# Patient Record
Sex: Female | Born: 1955 | Race: Black or African American | Hispanic: No | Marital: Single | Smoking: Never smoker
Health system: Southern US, Community
[De-identification: ages and names within clinical notes are randomized; demographics above are authoritative.]

## PROBLEM LIST (undated history)

## (undated) DIAGNOSIS — J45909 Unspecified asthma, uncomplicated: Secondary | ICD-10-CM

## (undated) DIAGNOSIS — F419 Anxiety disorder, unspecified: Secondary | ICD-10-CM

## (undated) DIAGNOSIS — G35 Multiple sclerosis: Secondary | ICD-10-CM

## (undated) DIAGNOSIS — I10 Essential (primary) hypertension: Secondary | ICD-10-CM

## (undated) HISTORY — DX: Anxiety disorder, unspecified: F41.9

## (undated) HISTORY — DX: Unspecified asthma, uncomplicated: J45.909

## (undated) HISTORY — PX: EYE SURGERY: SHX253

## (undated) HISTORY — DX: Essential (primary) hypertension: I10

## (undated) HISTORY — DX: Multiple sclerosis: G35

---

## 2020-09-06 DIAGNOSIS — Z131 Encounter for screening for diabetes mellitus: Secondary | ICD-10-CM | POA: Diagnosis not present

## 2020-09-06 DIAGNOSIS — Z13228 Encounter for screening for other metabolic disorders: Secondary | ICD-10-CM | POA: Diagnosis not present

## 2020-09-06 DIAGNOSIS — Z79899 Other long term (current) drug therapy: Secondary | ICD-10-CM | POA: Diagnosis not present

## 2020-09-22 ENCOUNTER — Other Ambulatory Visit: Payer: Self-pay | Admitting: Internal Medicine

## 2020-09-22 DIAGNOSIS — Z780 Asymptomatic menopausal state: Secondary | ICD-10-CM

## 2020-10-10 ENCOUNTER — Encounter: Payer: Self-pay | Admitting: Neurology

## 2020-10-17 ENCOUNTER — Other Ambulatory Visit: Payer: Self-pay | Admitting: Internal Medicine

## 2020-10-17 DIAGNOSIS — Z780 Asymptomatic menopausal state: Secondary | ICD-10-CM

## 2020-10-18 ENCOUNTER — Ambulatory Visit: Payer: Medicare PPO | Admitting: Obstetrics and Gynecology

## 2020-10-19 ENCOUNTER — Other Ambulatory Visit: Payer: Medicare PPO

## 2020-10-20 ENCOUNTER — Other Ambulatory Visit: Payer: Self-pay

## 2020-10-20 ENCOUNTER — Ambulatory Visit
Admission: RE | Admit: 2020-10-20 | Discharge: 2020-10-20 | Disposition: A | Discharge status: Home / Self Care | Payer: Medicare PPO | Source: Ambulatory Visit | Attending: Internal Medicine | Admitting: Internal Medicine

## 2020-10-20 DIAGNOSIS — Z780 Asymptomatic menopausal state: Secondary | ICD-10-CM

## 2020-10-27 LAB — EXTERNAL GENERIC LAB PROCEDURE: COLOGUARD: NEGATIVE

## 2020-10-27 LAB — COLOGUARD: COLOGUARD: NEGATIVE

## 2020-12-01 ENCOUNTER — Ambulatory Visit (INDEPENDENT_AMBULATORY_CARE_PROVIDER_SITE_OTHER): Payer: Medicare PPO | Admitting: Obstetrics and Gynecology

## 2020-12-01 ENCOUNTER — Other Ambulatory Visit: Payer: Self-pay

## 2020-12-01 ENCOUNTER — Other Ambulatory Visit (HOSPITAL_COMMUNITY)
Admission: RE | Admit: 2020-12-01 | Discharge: 2020-12-01 | Disposition: A | Discharge status: Home / Self Care | Payer: Medicare PPO | Source: Ambulatory Visit | Attending: Obstetrics and Gynecology | Admitting: Obstetrics and Gynecology

## 2020-12-01 ENCOUNTER — Encounter: Payer: Self-pay | Admitting: Obstetrics and Gynecology

## 2020-12-01 VITALS — BP 130/82 | HR 67 | Ht 62.0 in | Wt 140.0 lb

## 2020-12-01 DIAGNOSIS — Z01419 Encounter for gynecological examination (general) (routine) without abnormal findings: Secondary | ICD-10-CM

## 2020-12-01 DIAGNOSIS — Z124 Encounter for screening for malignant neoplasm of cervix: Secondary | ICD-10-CM

## 2020-12-01 DIAGNOSIS — Z1151 Encounter for screening for human papillomavirus (HPV): Secondary | ICD-10-CM | POA: Insufficient documentation

## 2020-12-01 NOTE — Progress Notes (Signed)
NGYN patient presents for Annual Exam .  Mammogram: 2 years ago per pt  Last pap: >3 yrs no Hs of Abnormal Paps Family Hx of Breast Cancer: None Bone Density: 10/20/20  CC: None

## 2020-12-01 NOTE — Progress Notes (Signed)
Subjective:     Joyce Hayes is a 65 y.o. female and is here for a comprehensive physical exam. The patient reports no problems. She denies any episodes of postmenopausal vaginal bleeding. She denies any urinary incontinence. She is sexually active and reports occasional vaginal dryness managed with OTC vaginal lubricant. Patient denies any pelvic pain or abnormal discharge  Past Medical History:  Diagnosis Date   Anxiety    Asthma    Hypertension    History reviewed. No pertinent surgical history. Family History  Problem Relation Age of Onset   Hypertension Mother    Alzheimer's disease Mother    Hypertension Father    Osteoporosis Father    Hepatitis C Brother      Social History   Socioeconomic History   Marital status: Unknown    Spouse name: Not on file   Number of children: Not on file   Years of education: Not on file   Highest education level: Not on file  Occupational History   Not on file  Tobacco Use   Smoking status: Never   Smokeless tobacco: Never  Vaping Use   Vaping Use: Never used  Substance and Sexual Activity   Alcohol use: Yes    Alcohol/week: 1.0 standard drink    Types: 1 Glasses of wine per week    Comment: socially   Drug use: Not Currently   Sexual activity: Yes    Partners: Male    Birth control/protection: None  Other Topics Concern   Not on file  Social History Narrative   Not on file   Social Determinants of Health   Financial Resource Strain: Not on file  Food Insecurity: Not on file  Transportation Needs: Not on file  Physical Activity: Not on file  Stress: Not on file  Social Connections: Not on file  Intimate Partner Violence: Not on file   Health Maintenance  Topic Date Due   COVID-19 Vaccine (1) Never done   HIV Screening  Never done   Hepatitis C Screening  Never done   PAP SMEAR-Modifier  Never done   COLONOSCOPY (Pts 45-26yrs Insurance coverage will need to be confirmed)  Never done   MAMMOGRAM  Never done    Zoster Vaccines- Shingrix (1 of 2) Never done   INFLUENZA VACCINE  01/15/2021   TETANUS/TDAP  03/10/2022   Pneumococcal Vaccine 110-63 Years old  Aged Out   HPV VACCINES  Aged Out       Review of Systems Pertinent items noted in HPI and remainder of comprehensive ROS otherwise negative.   Objective:  Blood pressure 130/82, pulse 67, height 5\' 2"  (1.575 m), weight 140 lb (63.5 kg).     GENERAL: Well-developed, well-nourished female in no acute distress.  HEENT: Normocephalic, atraumatic. Sclerae anicteric.  NECK: Supple. Normal thyroid.  LUNGS: Clear to auscultation bilaterally.  HEART: Regular rate and rhythm. BREASTS: Symmetric in size. No palpable masses or lymphadenopathy, skin changes, or nipple drainage. ABDOMEN: Soft, nontender, nondistended. No organomegaly. PELVIC: Normal external female genitalia. Vagina is pink and rugated.  Normal discharge. Normal appearing cervix. Uterus is normal in size.  No adnexal mass or tenderness. EXTREMITIES: No cyanosis, clubbing, or edema, 2+ distal pulses.    Assessment:    Healthy female exam.      Plan:    Pap smear collected Screening mammogram ordered Patient recently had a dexa scan Patient had colon cancer screening last month Patient recently seen by PCP and had blood work done Patient will be  contacted with abnormal results See After Visit Summary for Counseling Recommendations

## 2020-12-05 LAB — CYTOLOGY - PAP
Adequacy: ABSENT
Comment: NEGATIVE
High risk HPV: NEGATIVE

## 2020-12-06 ENCOUNTER — Ambulatory Visit (HOSPITAL_BASED_OUTPATIENT_CLINIC_OR_DEPARTMENT_OTHER)
Admission: RE | Admit: 2020-12-06 | Discharge: 2020-12-06 | Disposition: A | Discharge status: Home / Self Care | Payer: Medicare PPO | Source: Ambulatory Visit | Attending: Obstetrics and Gynecology | Admitting: Obstetrics and Gynecology

## 2020-12-06 ENCOUNTER — Encounter (HOSPITAL_BASED_OUTPATIENT_CLINIC_OR_DEPARTMENT_OTHER): Payer: Self-pay | Admitting: Radiology

## 2020-12-06 ENCOUNTER — Other Ambulatory Visit: Payer: Self-pay

## 2020-12-06 DIAGNOSIS — Z1231 Encounter for screening mammogram for malignant neoplasm of breast: Secondary | ICD-10-CM | POA: Diagnosis not present

## 2020-12-06 DIAGNOSIS — Z01419 Encounter for gynecological examination (general) (routine) without abnormal findings: Secondary | ICD-10-CM | POA: Diagnosis not present

## 2020-12-06 IMAGING — MG MM DIGITAL SCREENING BILAT W/ TOMO AND CAD
8 series · 8 of 24 positions shown · non-contrast
Comparison: Previous exam(s).

CLINICAL DATA: Screening.

EXAM:
DIGITAL SCREENING BILATERAL MAMMOGRAM WITH TOMOSYNTHESIS AND CAD
TECHNIQUE: Bilateral screening digital craniocaudal and mediolateral oblique
mammograms were obtained. Bilateral screening digital breast
tomosynthesis was performed. The images were evaluated with
computer-aided detection.

[R CC synth-2D]
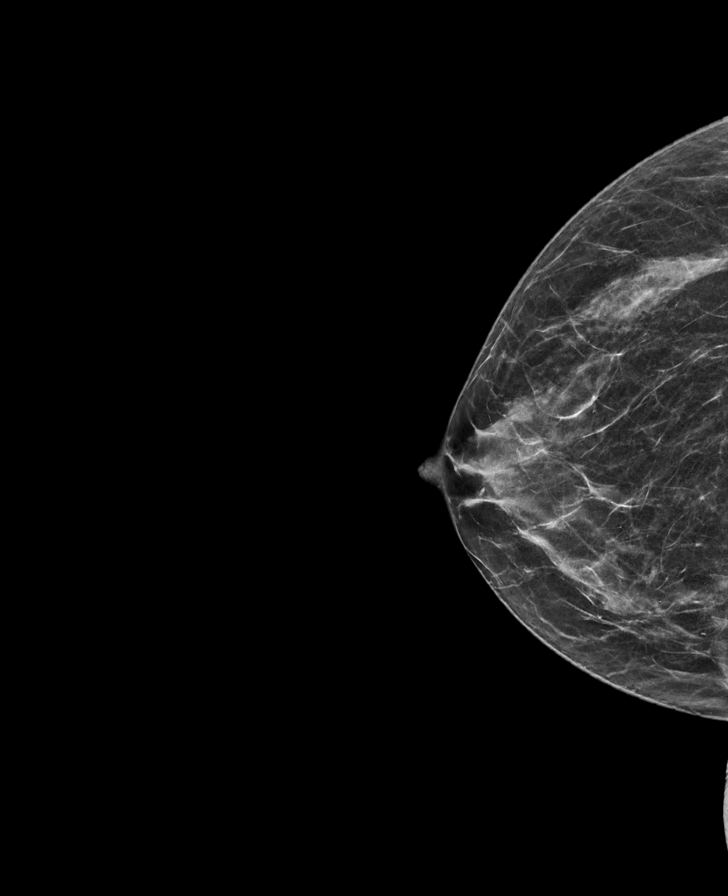

[R MLO synth-2D]
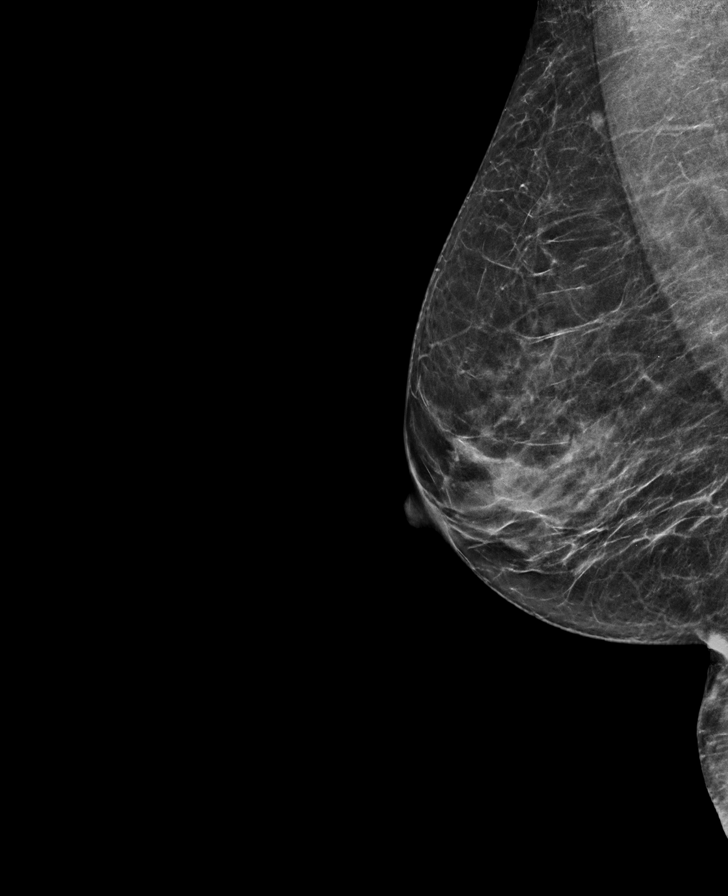

[L CC synth-2D]
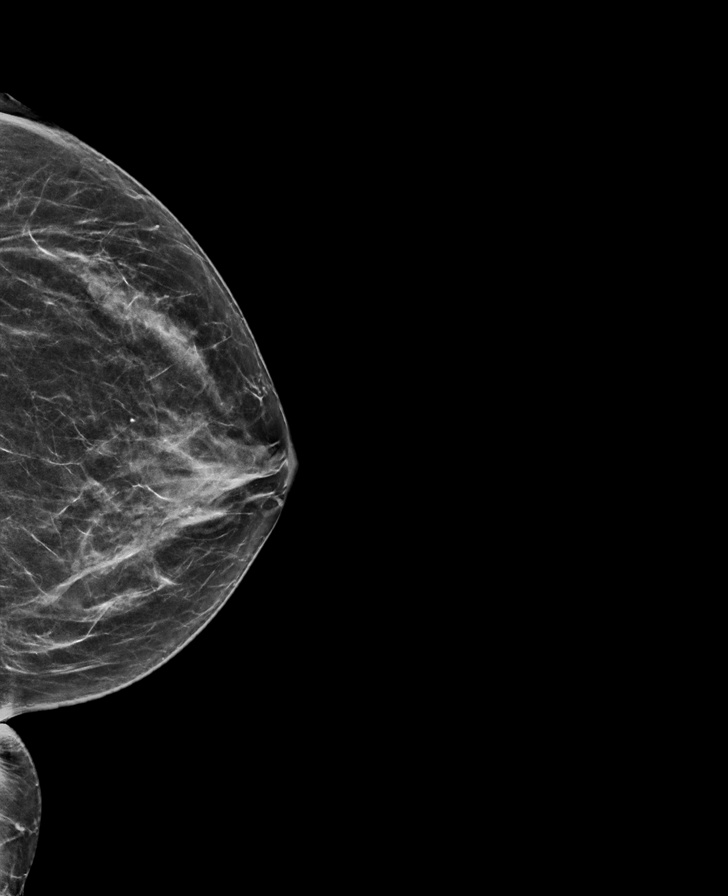

[L MLO synth-2D]
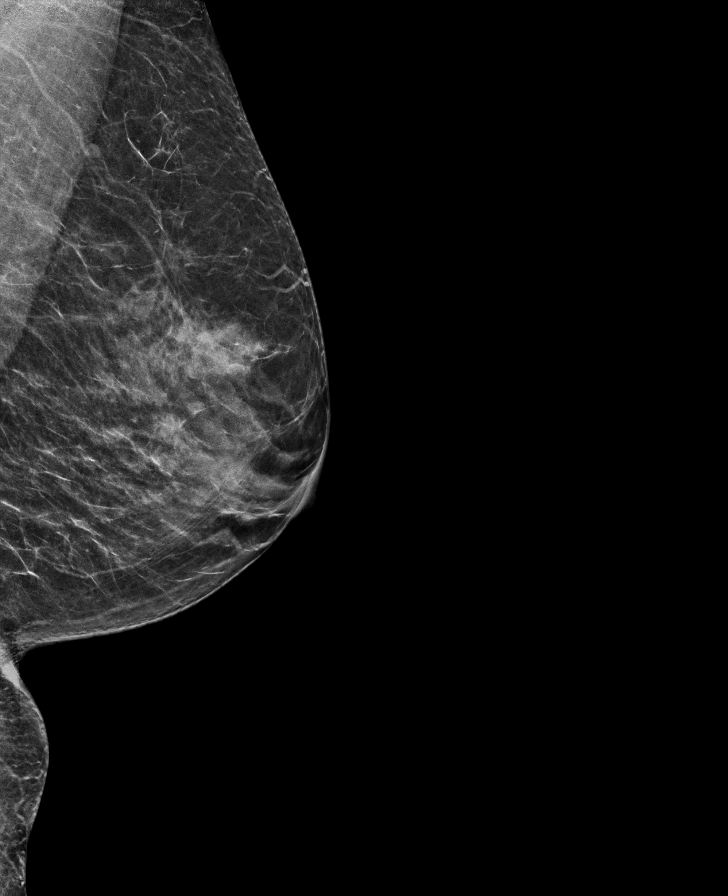

[L CC tomo · tomo slice 32/63.0]
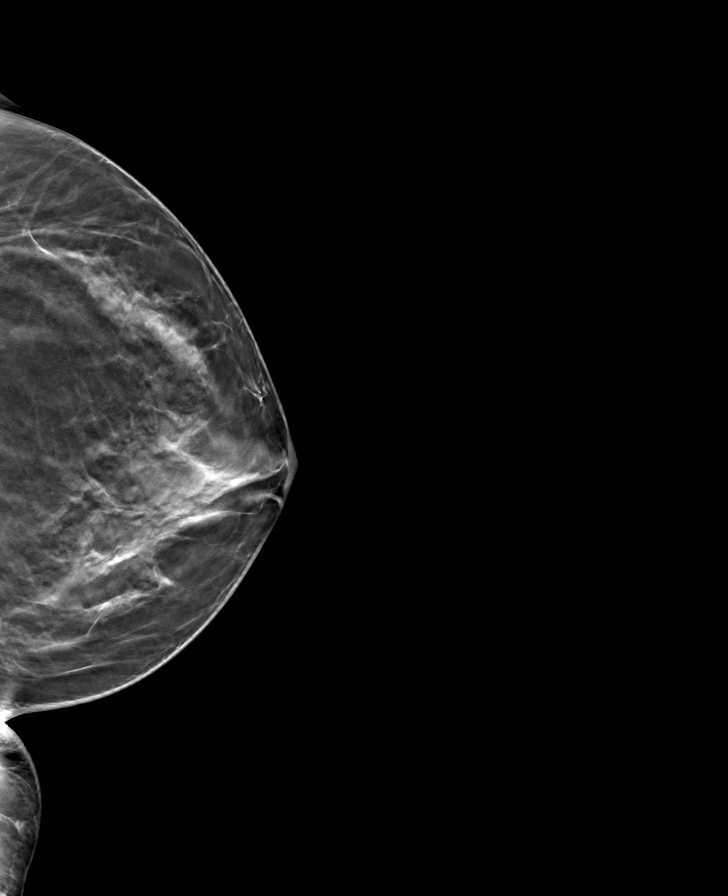

[R MLO tomo · tomo slice 31/61.0]
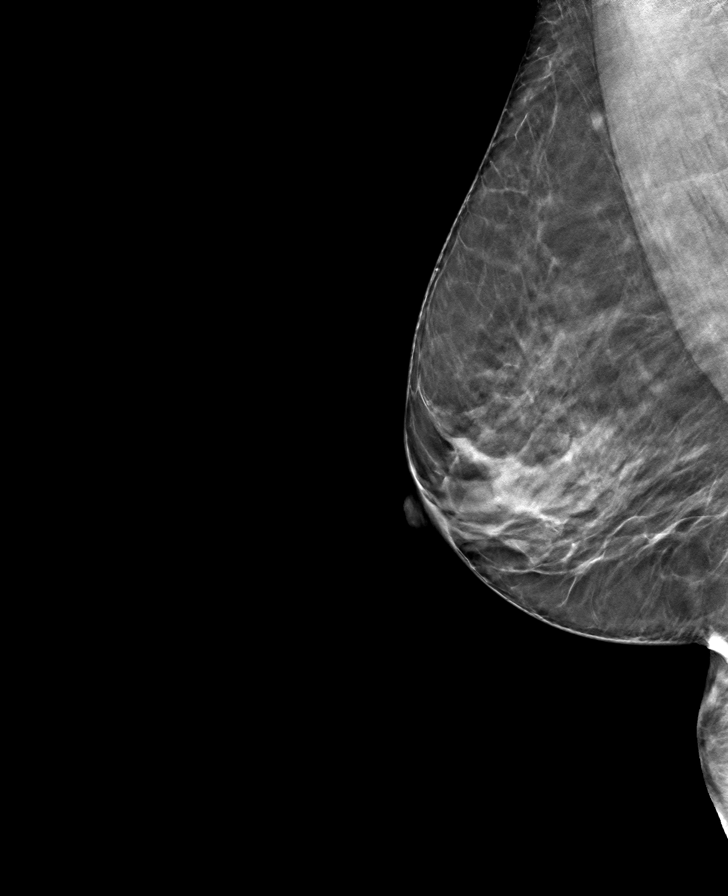

[R CC tomo · tomo slice 29/56.0]
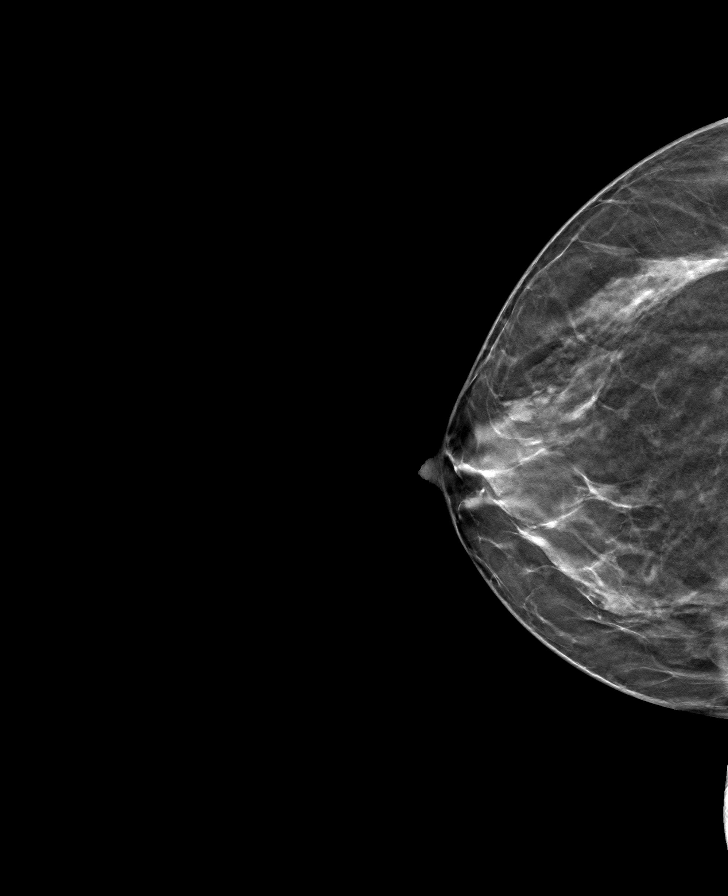

[L MLO tomo · tomo slice 30/59.0]
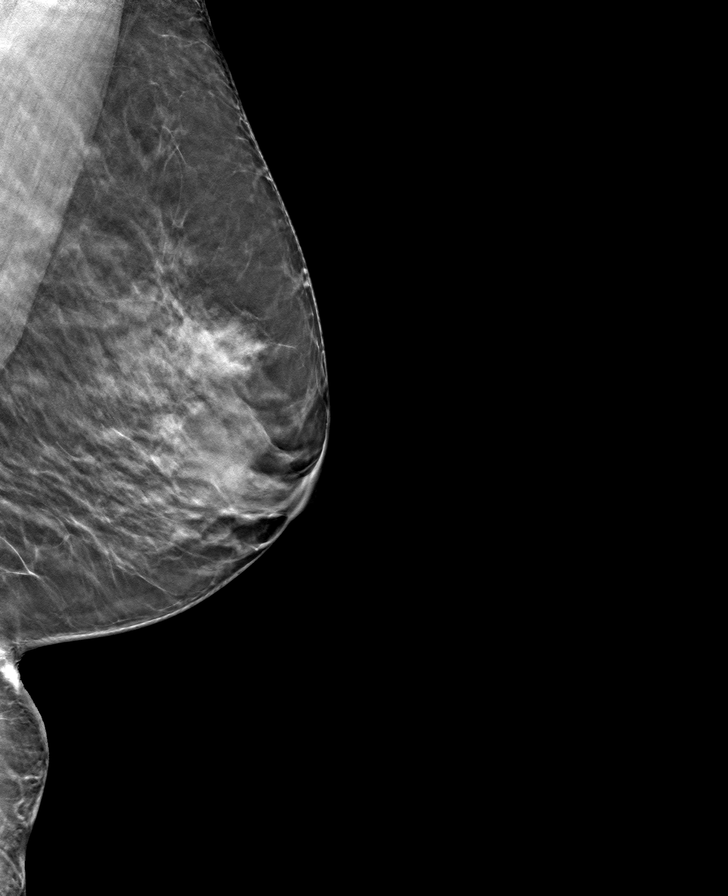

[8 of 24 positions shown; findings below may reference images not displayed]

ACR Breast Density Category b: There are scattered areas of
fibroglandular density.
FINDINGS: There are no findings suspicious for malignancy.
IMPRESSION: No mammographic evidence of malignancy. A result letter of this
screening mammogram will be mailed directly to the patient.

RECOMMENDATION:
Screening mammogram in one year. (Code:[BY])

BI-RADS CATEGORY  1: Negative.

## 2020-12-14 NOTE — Progress Notes (Signed)
NEUROLOGY CONSULTATION NOTE  Joyce Hayes MRN: [id number] DOB: 08-Apr-1956  Referring provider: Effie Shy, MD Primary care provider: Effie Shy, MD  Reason for consult:  multiple schlerosis  Assessment/Plan:   Multiple sclerosis Headache Depression/anxiety  Set up for next Ocrevus infusion in 5 months. Refilled duloxetine, amitriptyline and olanzapine.  Will check routine ECG to assess QTc MRI of brain and cervical spine with and without contrast Check quantitative immunoglobulin panel and vit D level today and again in 6 months (prior to follow up) D3 5000 IU daily Follow up 6 months.   Subjective:  Joyce Hayes is a 65 year old -harightnded female with MS, HTN, asthma and anxiety who presents to establish care for multiple sclerosis.  History supplemented by prior neurologist's and referring provider's notes.  Current DMT:  Ocrevus (started 09/17/2017, last dose 11/14/2020) Current medications:  Cymbalta 60mg  QD (pain and depression), amitriptyline 10mg  QHS (sleep, headache and depression), olanzapine 5mg  QHS (sleep, paranoid ideation), diazepam 5mg  BID PRN, D3 5000 IU QD, B12 QD, CoQ10  Diagnosed with MS in 2000 presenting with right foot numbness.  Diagnosed via MRI.  Did not have an LP.  Treated with steroids.  Vision:  Residual blurred vision in right eye from optic neuritis (approx 2004). Motor:  No issues but sometimes grasping with her hands feels a little weak with overuse. Sensory:  No Pain:  Chronic neck pain.  Chronic low back pain, right leg radiculopathy Headaches:  a pounding pain across the forehead.  No nausea, vomiting, photophobia, phonophobia or visual disturbance.  Typically last 30 minutes and occur about once or twice a month.  Treats with Advil.  Often associated with elevated blood pressure. Gait:  Sometimes feels a little off balance. Bowel/Bladder:  Some urinary incontinence.  History of constipation currently  well-managed. Fatigue:  Sometimes feels tired but nothing significant. Cognition:  Mild cognitive disorder. Mood:  Anxiety, depression - comes and goes.  Separated from her husband.  History of paranoid delusions (previously believed that her husband was planning to kill her and that FBI was working with her).  Past DMT:  Avonex ([phone], stopped due to flu-like symptoms), Rebif (2001-early 2019 (injection site reaction, depression, lost efficacy) Other past medications:  Wellbutrin  Covid-19 Vaccine:  Moderna - second dose 10/14/2019  Imaging: 11/03/2007 MRI BRAIN:  Reportedly stable compared to prior study from 2007. 06/15/2009 MRI BRAIN:  There are several nonenhancing periventricular white matter and juxtacortical white matter hyperintensities typical of MS.  There is mild atrophy and ventricular enlargement.  Reportedly showed possible minimal progression compared with prior study from 06/20/2005. 12/11/2009 MRI C-SPINE:  Nonenhancing MS lesions, C2 and C4-5.  Minor disc disease. 12/11/2009 MRI T-SPINE:  Nonenhancing MS lesion, T2-3. 08/22/2014 MRI C-SPINE:  Nonenhancing plaque in the dorsal column at C4 and C4-5, unchanged from prior study of 04/21/2012.  The previously described C2 lesion back in 2011 is not seen.  There is a sbutle lesion noted at T2-3. 08/22/2014 MRI T-SPINE:  Limited quality; T2-3 lesion is better seen on C-spine imaging in 2013 and 2016 and appears unchanged. 04/21/2017 MRI BRAIN:  Unchanged from prior study of 08/22/2014 04/21/2017 MRI C-SPINE:  Unchanged from prior study of 08/22/2014 03/30/2019 MRI BRAIN:  Unchanged from 04/21/2017. 03/30/2019 MRI C-SPINE:  Unchanged from 04/21/2017, with a stable right dorsal hyperintesity at C4-5 typical of MS.  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  Severe weaknesses in visual spatial and verbal function noted.  Moderate  impairment in executive  function, attention, problem solving, working memory, and global cognitive score.  Pure memory was only slightly diminished according to [employer].  No family history of MS.  Mother had Alzheimer's disease.   PAST MEDICAL HISTORY: Past Medical History:  Diagnosis Date   Anxiety    Asthma    Hypertension    Multiple sclerosis (HCC)     PAST SURGICAL HISTORY: No past surgical history on file.  MEDICATIONS: Current Outpatient Medications on File Prior to Visit  Medication Sig Dispense Refill   amitriptyline (ELAVIL) 10 MG tablet 1 tablet at bedtime.     bisoprolol-hydrochlorothiazide (ZIAC) 5-6.25 MG tablet 1 tablet     cyanocobalamin 2000 MCG tablet      estradiol (ESTRACE) 0.1 MG/GM vaginal cream Place 1 Applicatorful vaginally at bedtime.     Ocrelizumab (OCREVUS IV) Inject into the vein.     No current facility-administered medications on file prior to visit.    ALLERGIES: Allergies  Allergen Reactions   Chocolate    Grass Pollen(K-O-R-T-Swt Vern) Other (See Comments)   Soybean-Containing Drug Products Other (See Comments)    FAMILY HISTORY: Family History  Problem Relation Age of Onset   Hypertension Mother    Alzheimer's disease Mother    Hypertension Father    Osteoporosis Father    Hepatitis C Brother     Objective:  Blood pressure 133/85, pulse 62, height 5\' 2"  (1.575 m), weight 142 lb 12.8 oz (64.8 kg), SpO2 97 %. General: No acute distress.  Patient appears well-groomed.   Head:  Normocephalic/atraumatic Eyes:  fundi examined but not visualized Neck: supple, no paraspinal tenderness, full range of motion Back: No paraspinal tenderness Heart: regular rate and rhythm Lungs: Clear to auscultation bilaterally. Vascular: No carotid bruits. Neurological Exam: Mental status: alert and oriented to person, place, and time, recent and remote memory intact, fund of knowledge intact, attention and concentration intact, speech fluent and not dysarthric, language  intact. Cranial nerves: CN I: not tested CN II: pupils equal, round and reactive to light, visual fields intact CN III, IV, VI:  full range of motion, no nystagmus, no ptosis CN V: facial sensation intact. CN VII: upper and lower face symmetric CN VIII: hearing intact CN IX, X: gag intact, uvula midline CN XI: sternocleidomastoid and trapezius muscles intact CN XII: tongue midline Bulk & Tone: normal, no fasciculations. Motor:  muscle strength 5/5 throughout Sensation:  Pinprick, temperature and vibratory sensation intact. Deep Tendon Reflexes:  2+ throughout,  toes downgoing.   Finger to nose testing:  Without dysmetria.   Heel to shin:  Without dysmetria.   Gait:  Normal station and stride.  Timed 25 foot walk 6 seconds.  Some mild unsteadiness with tandem walk.  Romberg negative.    Thank you for allowing me to take part in the care of this patient.  , DO  CC: Shon Millet, MD

## 2020-12-15 ENCOUNTER — Ambulatory Visit: Payer: Medicare PPO | Admitting: Neurology

## 2020-12-15 ENCOUNTER — Encounter: Payer: Self-pay | Admitting: Neurology

## 2020-12-15 ENCOUNTER — Other Ambulatory Visit (INDEPENDENT_AMBULATORY_CARE_PROVIDER_SITE_OTHER): Payer: Medicare PPO

## 2020-12-15 ENCOUNTER — Other Ambulatory Visit: Payer: Self-pay

## 2020-12-15 VITALS — BP 133/85 | HR 62 | Ht 62.0 in | Wt 142.8 lb

## 2020-12-15 DIAGNOSIS — F419 Anxiety disorder, unspecified: Secondary | ICD-10-CM

## 2020-12-15 DIAGNOSIS — R519 Headache, unspecified: Secondary | ICD-10-CM

## 2020-12-15 DIAGNOSIS — G35 Multiple sclerosis: Secondary | ICD-10-CM | POA: Diagnosis not present

## 2020-12-15 DIAGNOSIS — Z79899 Other long term (current) drug therapy: Secondary | ICD-10-CM

## 2020-12-15 DIAGNOSIS — F32A Depression, unspecified: Secondary | ICD-10-CM

## 2020-12-15 LAB — VITAMIN D 25 HYDROXY (VIT D DEFICIENCY, FRACTURES): VITD: 71.81 ng/mL (ref 30.00–100.00)

## 2020-12-15 MED ORDER — AMITRIPTYLINE HCL 10 MG PO TABS: 10.0000 mg | ORAL_TABLET | Freq: Every day | ORAL | 5 refills | Status: DC

## 2020-12-15 MED ORDER — DULOXETINE HCL 60 MG PO CPEP: 60.0000 mg | ORAL_CAPSULE | Freq: Every day | ORAL | 5 refills | Status: DC

## 2020-12-15 MED ORDER — OLANZAPINE 5 MG PO TABS: 5.0000 mg | ORAL_TABLET | Freq: Every day | ORAL | 5 refills | Status: DC

## 2020-12-15 NOTE — Progress Notes (Signed)
Pt advised of her vtiamin d level.

## 2020-12-15 NOTE — Patient Instructions (Addendum)
Refilled amitriptyline, duloxetine and olanzapine.  Will check an EKG  MRI of brain and cervical spine with and without contrast. We have sent a referral to [facility] for your MRI and they will call you directly to schedule your appointment. They are located at [address] [facility]. If you need to contact them directly please call [phone].  Check quantitative immunoglobulin panel and vit D level today and again in 6 months.  If nobody contacted you to get labs by 2 weeks prior to next appointment, contact us. Your provider has requested that you have labwork completed today. Please go to [facility] ([phone]) on the second floor of this building before leaving the office today. You do not need to check in. If you are not called within 15 minutes please check with the front desk.   Plan for next Ocrevus infusion in 5 months. Follow up 6 months.

## 2020-12-16 LAB — IGG, IGA, IGM
IgG (Immunoglobin G), Serum: 1601 mg/dL — ABNORMAL HIGH (ref 600–1540)
IgM, Serum: 34 mg/dL — ABNORMAL LOW (ref 50–300)
Immunoglobulin A: 249 mg/dL (ref 70–320)

## 2020-12-19 ENCOUNTER — Telehealth: Payer: Self-pay | Admitting: Pharmacy Technician

## 2020-12-19 NOTE — Telephone Encounter (Signed)
Auth Submission: PENDING Payer: [employer]/MEDICARE Medication & CPT/J Code(s) submitted: Ocrevus [employer]) [phone] Route of submission (phone, fax, portal): COVER MY MED Auth type: Buy/Bill Units/visits requested: 2 Reference number: [id number]   Will update once we receive a response.

## 2020-12-20 ENCOUNTER — Telehealth: Payer: Self-pay | Admitting: Neurology

## 2020-12-20 NOTE — Telephone Encounter (Signed)
Auth Submission: APPROVED Payer: [employer]/MEDICARE Medication & CPT/J Code(s) submitted: Joyce Hayes Joyce Hayes) [phone] Route of submission (phone, fax, portal): COVER MY MED Auth type: Buy/Bill Units/visits requested: 2 Reference number: [id number]  REP: [place]  Patient had previous PA on file/approved -  PA# [id number]  APPROVAL DATE: 10/13/20 - 06/16/21.  COVER MY MEDS: APPROVED:   Submitted new PA on 12/19/20 (due to change of site) KEY: [id number]  PA CASE#: [id number]  Confirmed with REP ([place]) change of site was updated on previous approval.

## 2020-12-20 NOTE — Telephone Encounter (Signed)
MRI- GI they should call you. Phone number given to pt.

## 2020-12-20 NOTE — Telephone Encounter (Signed)
Pt called in and wanted to update the pharmacy. The pharmacy should be n [address] [phone]. She also wants a call back from sheena regarding infusion. She has questions about it.

## 2020-12-21 ENCOUNTER — Other Ambulatory Visit: Payer: Self-pay | Admitting: Pharmacy Technician

## 2020-12-21 NOTE — Telephone Encounter (Signed)
Pt States [employer] wanted to know if the infusion center in a provider office or is it a center.    Advised pt to have [facility] call [place] [facility] directly.

## 2020-12-22 ENCOUNTER — Ambulatory Visit: Payer: Medicare PPO | Admitting: Student

## 2020-12-22 ENCOUNTER — Other Ambulatory Visit: Payer: Self-pay

## 2020-12-22 VITALS — BP 129/74 | HR 64

## 2020-12-22 DIAGNOSIS — I10 Essential (primary) hypertension: Secondary | ICD-10-CM

## 2020-12-22 DIAGNOSIS — G35 Multiple sclerosis: Secondary | ICD-10-CM

## 2020-12-22 DIAGNOSIS — Z5181 Encounter for therapeutic drug level monitoring: Secondary | ICD-10-CM

## 2020-12-22 NOTE — Progress Notes (Signed)
Patient presents to our office today for routine EKG prior to infusion therapy for multiple sclerosis with Ocrevus by Dr. Everlena Cooper. QTc  is normal on EKG today.   EKG 12/22/2020: Sinus rhythm at a rate of 64 bpm.  Normal axis.  Normal QTc.  No evidence of ischemia or underlying injury pattern.    ICD-10-CM   1. Multiple sclerosis (HCC)  G35 EKG 12-Lead    2. Essential hypertension  I10     3. Encounter for therapeutic drug monitoring  Z51.81       Joyce Halsted, PA-C 12/22/2020, 12:30 PM Office: [phone]  CC: Dr. Everlena Cooper

## 2020-12-25 ENCOUNTER — Telehealth: Payer: Self-pay | Admitting: Neurology

## 2020-12-25 ENCOUNTER — Other Ambulatory Visit: Payer: Self-pay | Admitting: Neurology

## 2020-12-25 MED ORDER — DIAZEPAM 5 MG PO TABS: ORAL_TABLET | ORAL | 0 refills | Status: DC

## 2020-12-25 NOTE — Telephone Encounter (Signed)
Pt is sch for an MRI on 01/06/21 and she is claustrophobic and would like to have something called in to [facility] on [address] for her to take.

## 2020-12-26 NOTE — Telephone Encounter (Signed)
PER Dr.Jaffe Done. Patient will need a driver to and from the MRI.   LMOVM.

## 2020-12-27 ENCOUNTER — Telehealth: Payer: Self-pay

## 2020-12-27 MED ORDER — OLANZAPINE 5 MG PO TABS: 5.0000 mg | ORAL_TABLET | Freq: Every day | ORAL | 5 refills | Status: DC

## 2020-12-27 MED ORDER — AMITRIPTYLINE HCL 10 MG PO TABS: 10.0000 mg | ORAL_TABLET | Freq: Every day | ORAL | 5 refills | Status: DC

## 2020-12-27 MED ORDER — DULOXETINE HCL 60 MG PO CPEP: 60.0000 mg | ORAL_CAPSULE | Freq: Every day | ORAL | 5 refills | Status: DC

## 2020-12-27 NOTE — Telephone Encounter (Signed)
Fax received from [facility] in pharmacy)  Please send scripts amtriptlyine, Duxoxtetine,olanzapine pt changing to mail in pharmacy.   Scripts sent to [facility] on day of visit cancelled.

## 2021-01-06 ENCOUNTER — Other Ambulatory Visit: Payer: Self-pay

## 2021-01-06 ENCOUNTER — Ambulatory Visit
Admission: RE | Admit: 2021-01-06 | Discharge: 2021-01-06 | Disposition: A | Discharge status: Home / Self Care | Payer: Medicare PPO | Source: Ambulatory Visit | Attending: Neurology | Admitting: Neurology

## 2021-01-06 DIAGNOSIS — G35 Multiple sclerosis: Secondary | ICD-10-CM

## 2021-01-06 IMAGING — MR MR CERVICAL SPINE WO/W CM
5 of 8 series · 26 of 48 positions shown · IV contrast (13ml multihance)
Comparison: Brain MRI the same day reported separately.

CLINICAL DATA: 64-year-old female diagnosed with multiple sclerosis
in the [AGE].
TECHNIQUE: Multiplanar and multiecho pulse sequences of the cervical spine, to
include the craniocervical junction and cervicothoracic junction,
were obtained without and with intravenous contrast.

CONTRAST:  13mL MULTIHANCE GADOBENATE DIMEGLUMINE 529 MG/ML IV SOLN
in conjunction with contrast enhanced imaging of the brain reported
separately.

[Series 5: T1 · sagittal · 3.0mm · 0.66mm/px · 4 of 15 slices shown (1 of 3)]
[im 1/15]
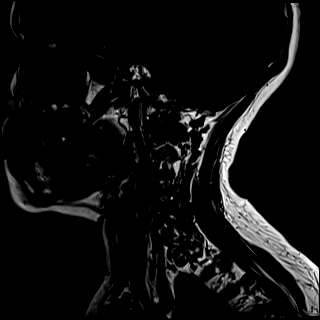
[im 5/15]
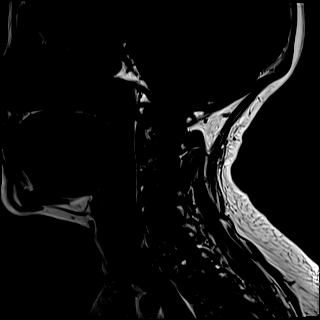
[im 10/15]
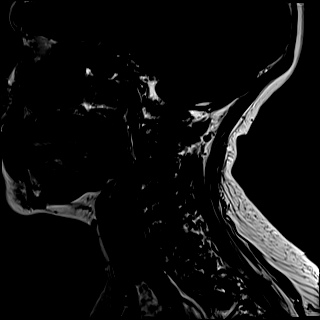
[im 15/15]
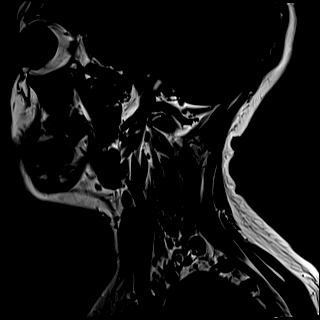

[Series 7: T2 · axial · 3.0mm · 0.50mm/px · z∈[-157,-65]mm · 8 of 30 slices shown (1 of 2)]
[im 1/30]
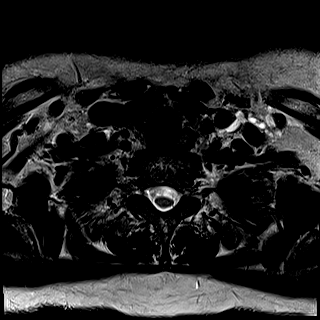
[im 5/30]
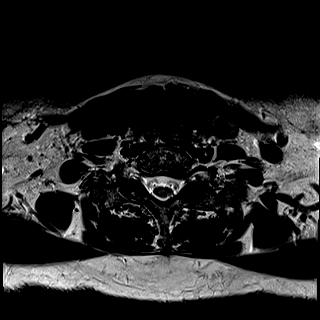
[im 9/30]
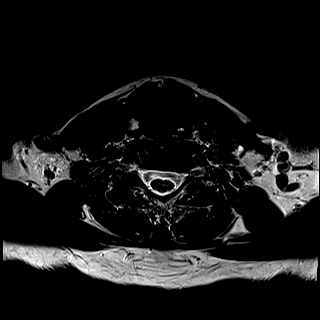
[im 13/30]
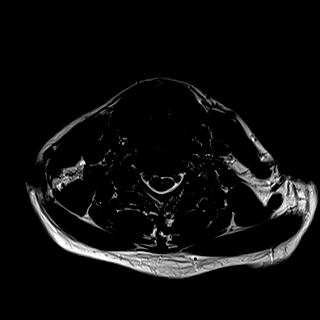
[im 17/30]
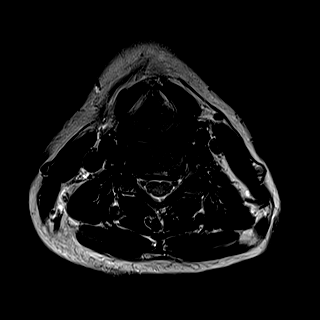
[im 21/30]
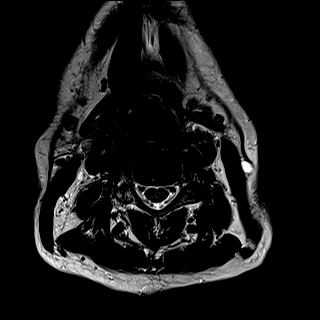
[im 25/30]
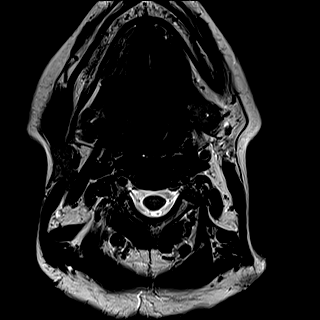
[im 30/30]
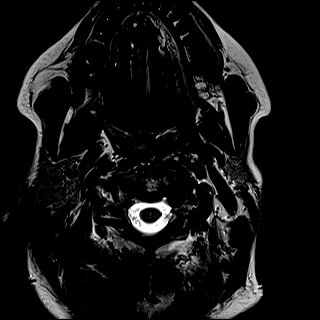

[Series 9: T1 · axial · non-contrast · 3.0mm · 0.31mm/px · z∈[-157,-65]mm · 8 of 30 slices shown (2 of 3)]
[im 1/30]
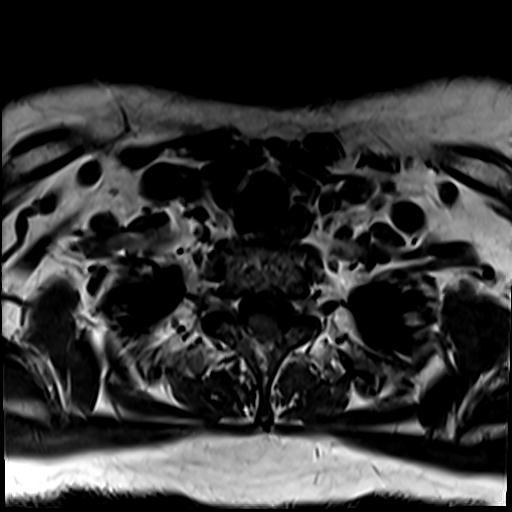
[im 5/30]
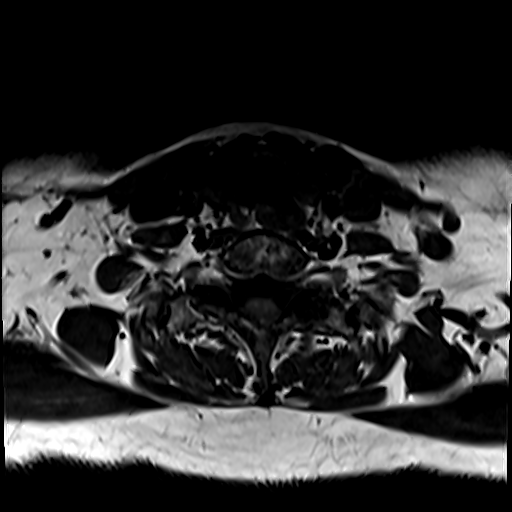
[im 9/30]
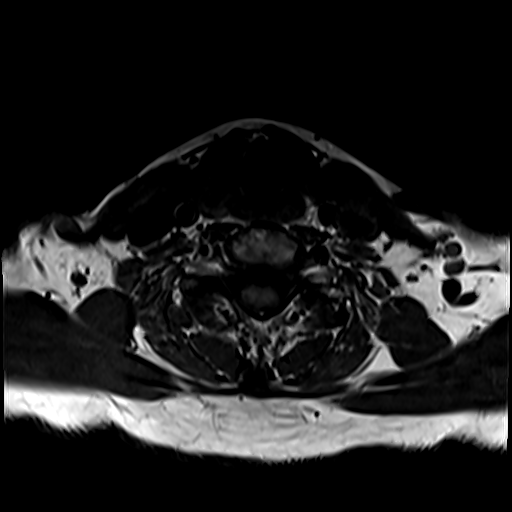
[im 13/30]
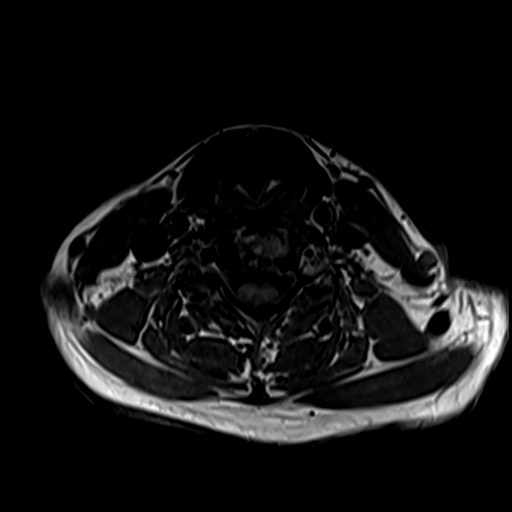
[im 17/30]
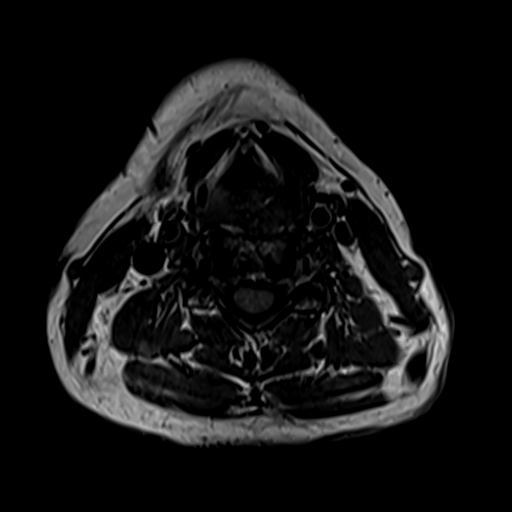
[im 21/30]
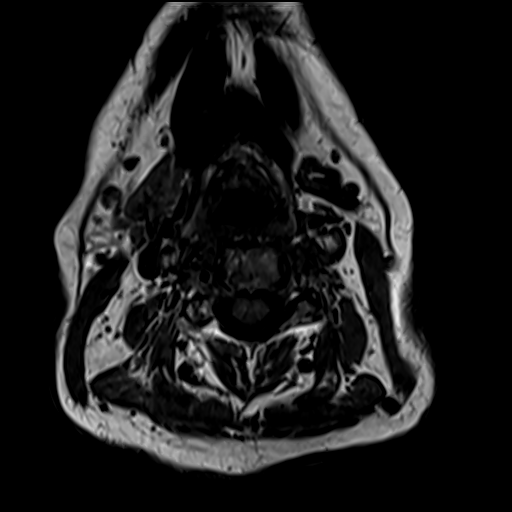
[im 25/30]
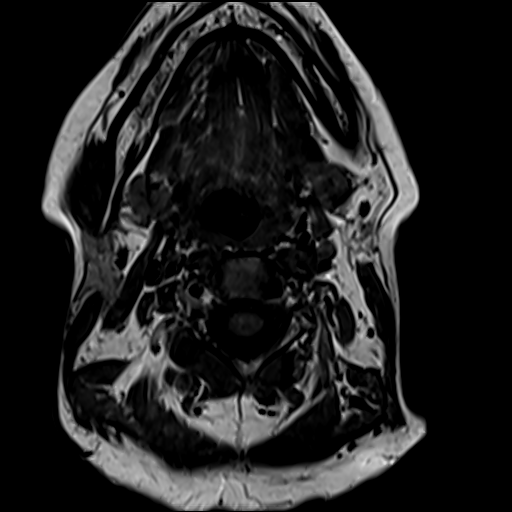
[im 30/30]
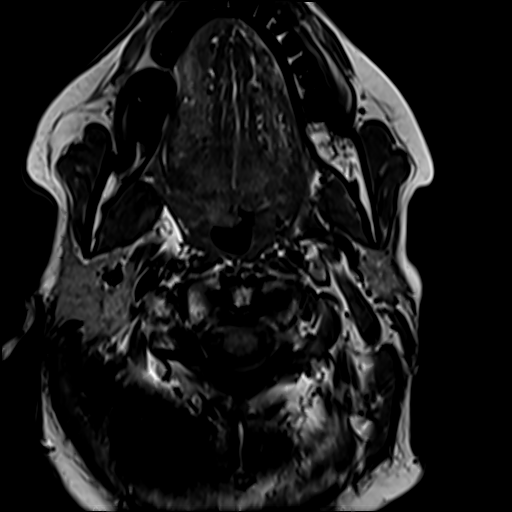

[Series 10: T2 · sagittal · 3.0mm · 0.55mm/px · 4 of 15 slices shown (2 of 2)]
[im 1/15]
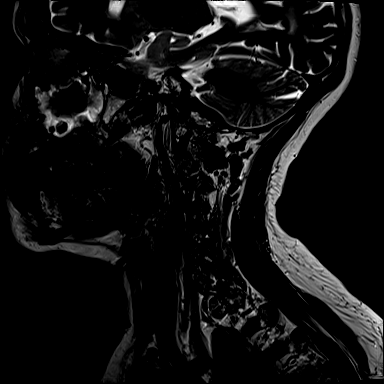
[im 5/15]
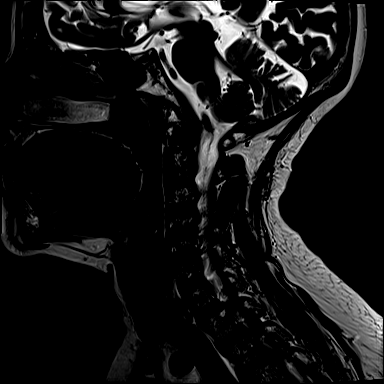
[im 10/15]
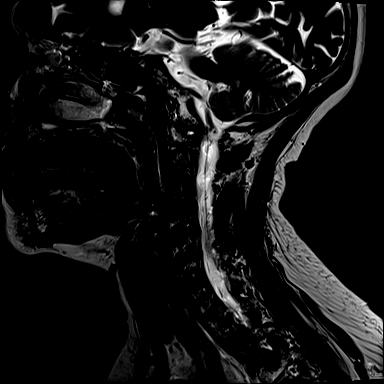
[im 15/15]
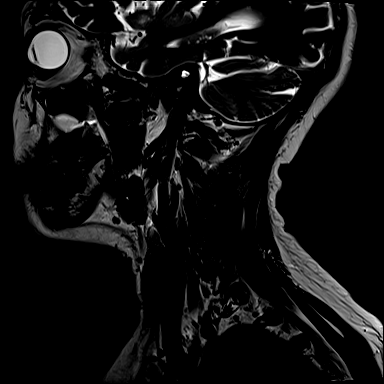

[Series 12: T1 · axial · 3.0mm · 0.31mm/px · z∈[-157,-144]mm · 2 of 30 slices shown (3 of 3)]
[im 1/30]
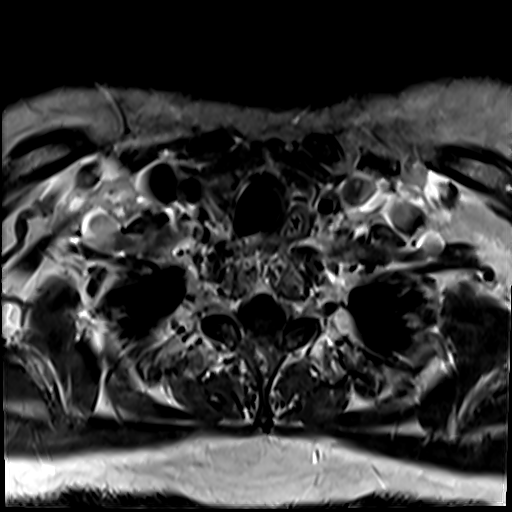
[im 5/30]
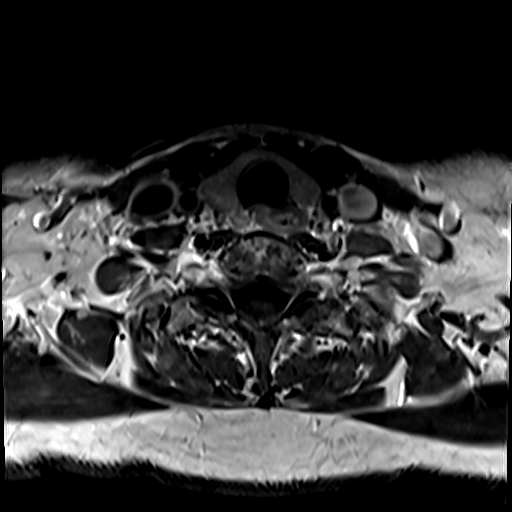

[26 of 48 positions shown; findings below may reference images not displayed]

Chronic neck pain. Headache.

Chronic right eye blurred vision secondary to optic neuritis in
approximately [OR].

[OR] outside brain MRI reportedly stable since a [OR] prior.

[OR] cervical spine MRI reportedly with chronic right side dorsal
spinal cord lesion at C4-C5.

EXAM:
MRI CERVICAL SPINE WITHOUT AND WITH CONTRAST
FINDINGS: Alignment: Preserved cervical lordosis. No significant
spondylolisthesis.

Vertebrae: Heterogeneous bone marrow signal, although no marrow
edema or evidence of acute osseous abnormality. No suspicious or
enhancing marrow lesion.

Cord: Fairly subtle cervical spinal cord heterogeneity. Dorsal cord
lesion at C4-C5 most apparent on T2 * (series 8, image 13).
Relatively maintained spinal cord volume. No abnormal intradural
enhancement. No dural thickening.

Posterior Fossa, vertebral arteries, paraspinal tissues:
Cervicomedullary junction is within normal limits. Brain is detailed
separately. Preserved major vascular flow voids in the neck. The
right vertebral artery appears mildly dominant. Subcentimeter round
T2 hyperintense right thyroid lobe nodule Not clinically
significant; no follow-up imaging recommended (ref: [HOSPITAL]. [DATE]): 143-50).Otherwise negative visible neck soft
tissues. Negative visible lung apices.

Disc levels:

C2-C3:  Negative.

C3-C4:  Mild disc bulging. No stenosis.

C4-C5: Disc space loss. Mild circumferential disc bulge and endplate
spurring most affecting the neural foramina. Mild ligament flavum
hypertrophy. No spinal stenosis. Mild to moderate bilateral C5
foraminal stenosis.

C5-C6: Disc space loss with mild circumferential disc bulge and
endplate spurring most affecting the neural foramina. Mild ligament
flavum hypertrophy. No spinal stenosis. Mild left and
mild-to-moderate right C6 foraminal stenosis.

C6-C7:  Negative.

C7-T1:  Mild facet hypertrophy. No stenosis.

No visible upper thoracic spinal stenosis. Disc space loss and facet
hypertrophy is visible at T2-T3 with up to mild T2 neural foraminal
stenosis.
IMPRESSION: 1. Only subtle evidence of chronic demyelinating disease in the
cervical spinal cord. No active demyelination.

2. Mild for age cervical spine degeneration with no spinal stenosis.
Up to moderate bilateral C5 and right C6 neural foraminal stenosis.

## 2021-01-06 IMAGING — MR MR HEAD WO/W CM
14 series · 48 of 48 positions shown · IV contrast (multihance)
Comparison: None available.

CLINICAL DATA: 64-year-old female diagnosed with multiple sclerosis
in the [AGE].
TECHNIQUE: Multiplanar, multiecho pulse sequences of the brain and surrounding
structures were obtained without and with intravenous contrast.

CONTRAST:  13mL MULTIHANCE GADOBENATE DIMEGLUMINE 529 MG/ML IV SOLN

[Series 5: T1 · sagittal · 4.0mm · 0.75mm/px · 2 of 31 slices shown (1 of 3)]
[im 1/31]
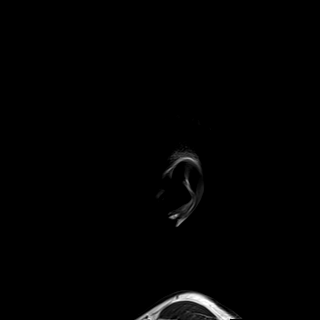
[im 31/31]
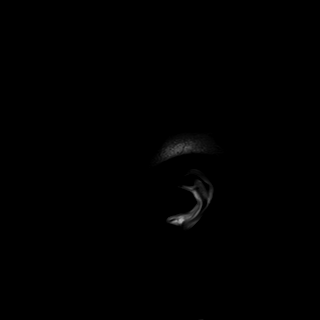

[Series 6: FLAIR · sagittal · 4.0mm · 0.72mm/px · 1 of 25 slices shown (1 of 2)]
[im 1/25]
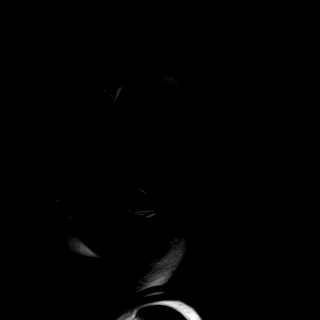

[Series 7: DWI · axial · 3.0mm · 0.94mm/px · z∈[-61,+78]mm · 8 of 160 slices shown (1 of 3)]
[im 1/160]
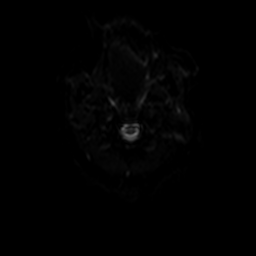
[im 23/160]
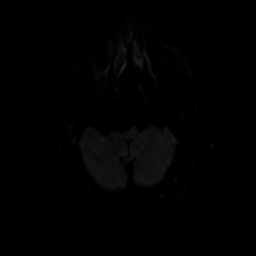
[im 46/160]
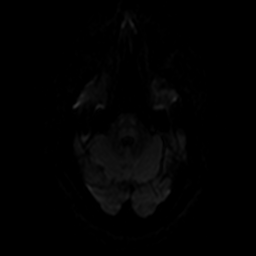
[im 69/160]
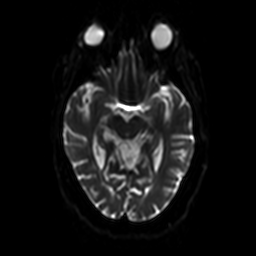
[im 91/160]
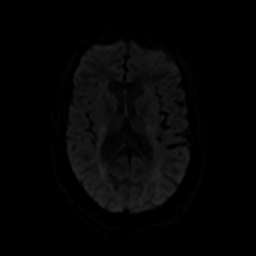
[im 114/160]
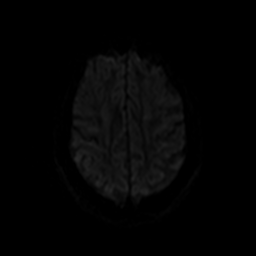
[im 137/160]
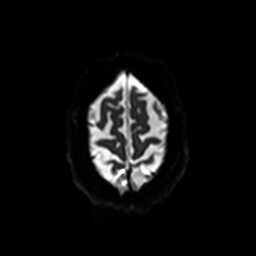
[im 160/160]
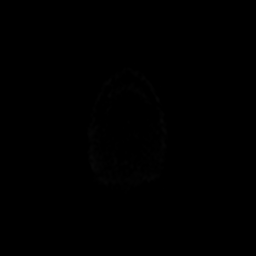

[Series 8: ax dwi_tracew · axial · 3.0mm · 0.94mm/px · z∈[-61,+78]mm · 4 of 80 slices shown]
[im 1/80]
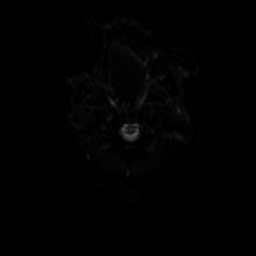
[im 27/80]
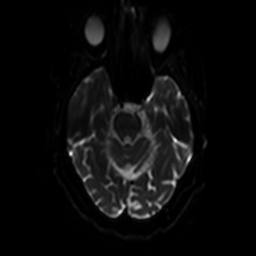
[im 53/80]
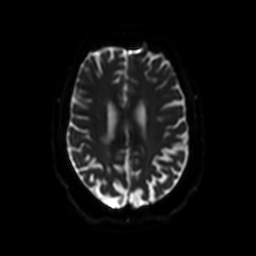
[im 80/80]
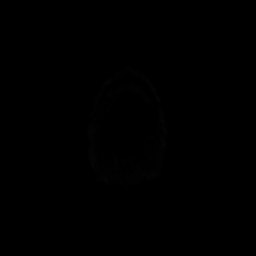

[Series 9: ax dwi_adc · axial · 3.0mm · 0.94mm/px · z∈[-61,+78]mm · 2 of 40 slices shown]
[im 1/40]
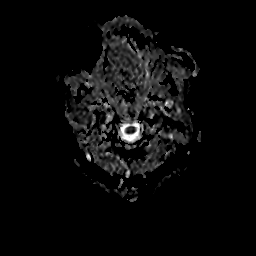
[im 40/40]
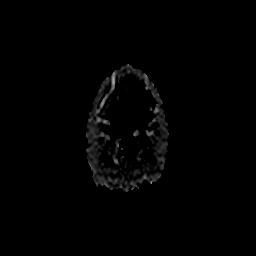

[Series 10: DWI · coronal · 5.0mm · 1.44mm/px · 3 of 60 slices shown (2 of 3)]
[im 1/60]
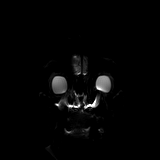
[im 30/60]
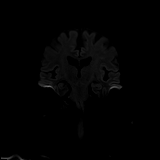
[im 60/60]
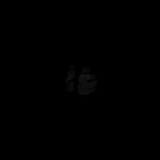

[Series 11: DWI · coronal · 5.0mm · 1.44mm/px · 1 of 30 slices shown (3 of 3)]
[im 1/30]
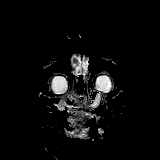

[Series 12: T2 · axial · 4.0mm · 0.36mm/px · 1 of 29 slices shown (1 of 2)]
[im 1/29]
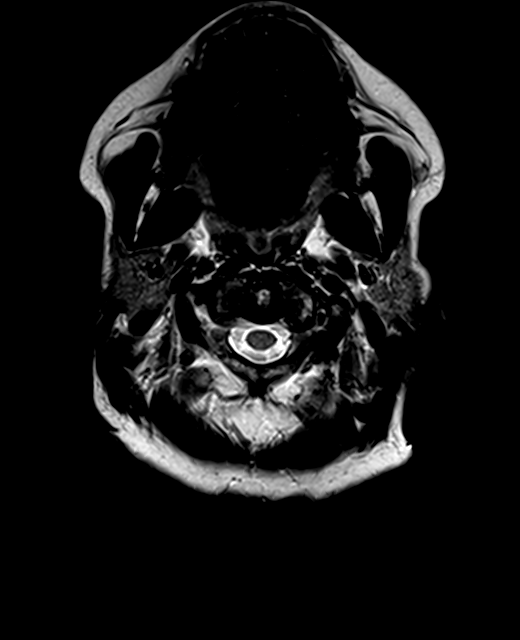

[Series 13: FLAIR · axial · 3.0mm · 0.72mm/px · 1 of 26 slices shown (2 of 2)]
[im 1/26]
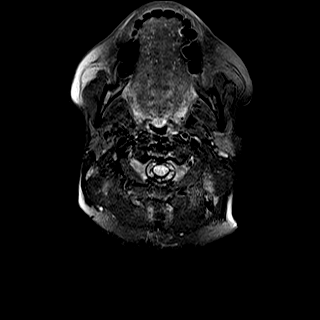

[Series 14: swi_images · axial · 1.5mm · 0.90mm/px · z∈[-57,+83]mm · 5 of 96 slices shown]
[im 1/96]
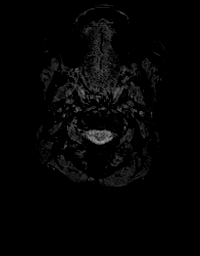
[im 24/96]
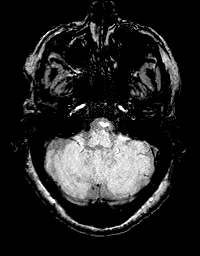
[im 48/96]
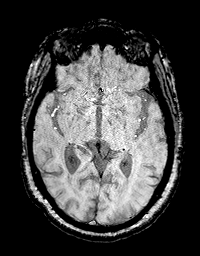
[im 72/96]
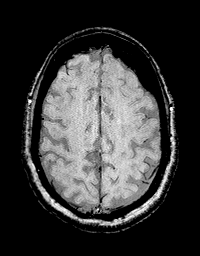
[im 96/96]
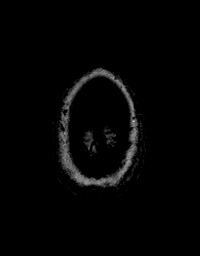

[Series 16: T1 · axial · 1.0mm · 0.94mm/px · z∈[-66,+91]mm · 8 of 160 slices shown (2 of 3)]
[im 1/160]
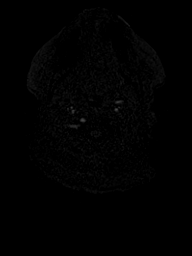
[im 23/160]
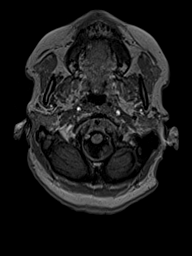
[im 46/160]
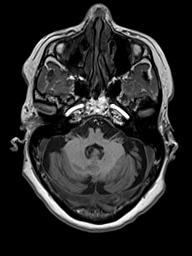
[im 69/160]
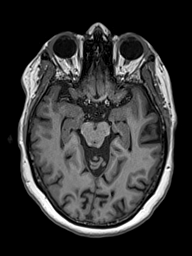
[im 91/160]
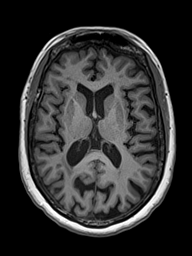
[im 114/160]
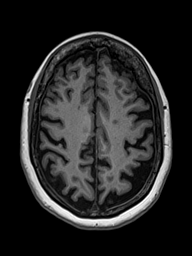
[im 137/160]
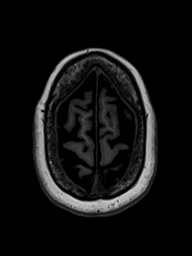
[im 160/160]
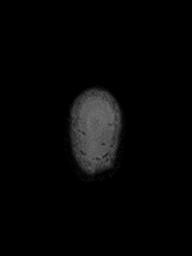

[Series 17: T2 · coronal · 4.0mm · 0.36mm/px · 2 of 35 slices shown (2 of 2)]
[im 1/35]
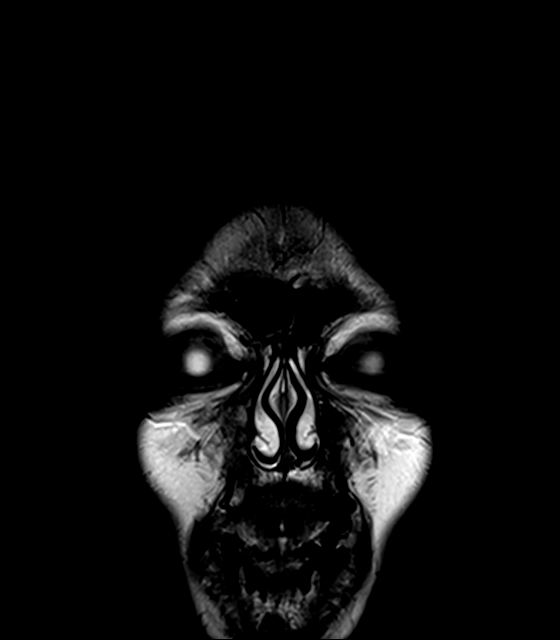
[im 35/35]
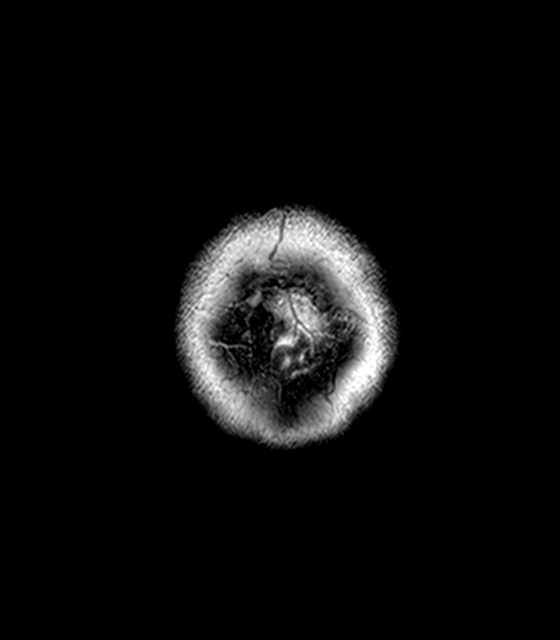

[Series 18: T1 post-contrast · coronal · 4.0mm · 0.72mm/px · 2 of 35 slices shown]
[im 1/35]
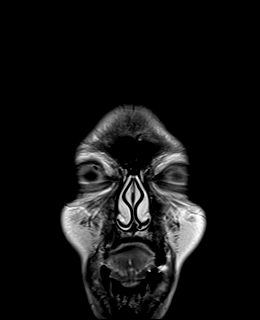
[im 35/35]
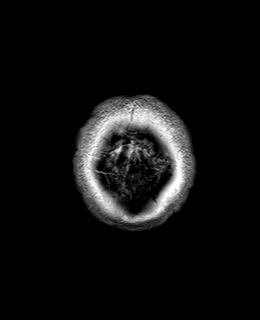

[Series 19: T1 · axial · 1.0mm · 0.94mm/px · z∈[-66,+91]mm · 8 of 160 slices shown (3 of 3)]
[im 1/160]
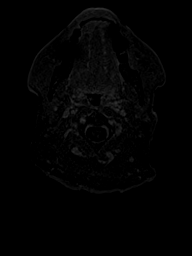
[im 23/160]
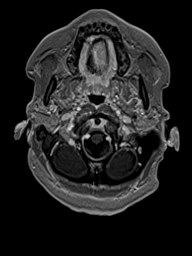
[im 46/160]
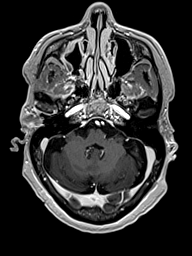
[im 69/160]
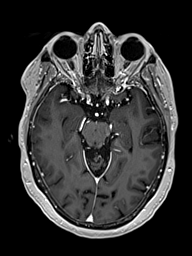
[im 91/160]
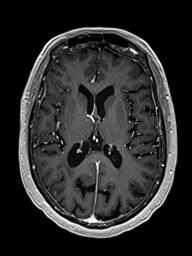
[im 114/160]
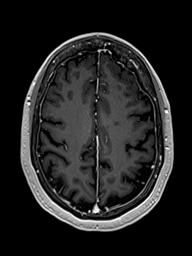
[im 137/160]
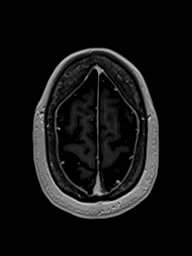
[im 160/160]
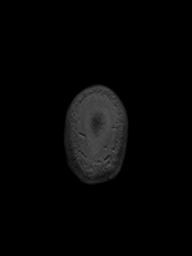

[48 of 48 positions shown; findings below may reference images not displayed]

Chronic neck pain. Headache.

Chronic right eye blurred vision secondary to optic neuritis in
approximately [5E].

[5E] outside brain MRI reportedly stable since a [5E] prior.

[5E] cervical spine MRI reportedly with chronic right side dorsal
spinal cord lesion at C4-C5.

EXAM:
MRI HEAD WITHOUT AND WITH CONTRAST
FINDINGS: Brain: No restricted diffusion to suggest acute infarction. No
midline shift, mass effect, evidence of mass lesion,
ventriculomegaly, extra-axial collection or acute intracranial
hemorrhage. Cervicomedullary junction and pituitary are within
normal limits.

Scattered patchy and nodular bilateral periventricular, central, and
subcortical white matter T2 and FLAIR hyperintense lesions with T2
shine through on DWI. Right hemisphere slightly more affected.
Marginal involvement of the corpus callosum which demonstrates some
volume loss. No cortical involvement identified. Deep gray matter
nuclei, brainstem and cerebellum also appear relatively spared. No
abnormal enhancement identified.

No dural thickening. No chronic cerebral blood products.

Vascular: Major intracranial vascular flow voids are preserved.
Mildly dominant and tortuous distal right vertebral artery. Major
dural venous sinuses are enhancing and appear to be patent.

Skull and upper cervical spine: Cervical spine is detailed
separately. Visualized bone marrow signal is within normal limits.

Sinuses/Orbits: Grossly symmetric orbits soft tissues with
postoperative changes to both globes. Mild to moderate paranasal
sinus mucosal thickening primarily in the maxillary sinuses.

Other: Small volume fluid and posterior mastoid air cells on the
right. Left mastoids are clear. Negative visible nasopharynx and
other internal auditory structures. Negative visible scalp and face.
IMPRESSION: 1. Moderately advanced chronic demyelinating disease, although
limited to the cerebral white matter. No active demyelination.
2. No other intracranial abnormality identified.
3. Cervical MRI today reported separately.

## 2021-01-06 MED ORDER — GADOBENATE DIMEGLUMINE 529 MG/ML IV SOLN
13.0000 mL | Freq: Once | INTRAVENOUS | Status: AC | PRN
  Administered 2021-01-06: 13 mL via INTRAVENOUS

## 2021-01-09 NOTE — Progress Notes (Signed)
Pt advised of MRI results. Pt advised to please give [facility] to schedule

## 2021-01-10 ENCOUNTER — Telehealth: Payer: Self-pay | Admitting: Neurology

## 2021-01-10 NOTE — Telephone Encounter (Signed)
Pt would like a call back regarding the infusion center number. Said she would like to sch an appt herself

## 2021-01-10 NOTE — Telephone Encounter (Signed)
Pt advised of [facility] number.

## 2021-02-28 ENCOUNTER — Telehealth: Payer: Self-pay | Admitting: Pharmacy Technician

## 2021-02-28 NOTE — Telephone Encounter (Signed)
Auth Submission: OCREVUS PAP  Enrolled patient in [facility] program 02/08/21. Dr. Everlena Cooper has signed forms and was faxed. Awaiting approval status.  Will f/u with response.

## 2021-05-02 ENCOUNTER — Ambulatory Visit (INDEPENDENT_AMBULATORY_CARE_PROVIDER_SITE_OTHER): Payer: Medicare PPO

## 2021-05-02 ENCOUNTER — Other Ambulatory Visit: Payer: Self-pay

## 2021-05-02 VITALS — BP 146/84 | HR 72 | Temp 97.6°F | Resp 16 | Ht 62.0 in | Wt 147.6 lb

## 2021-05-02 DIAGNOSIS — G35 Multiple sclerosis: Secondary | ICD-10-CM | POA: Diagnosis not present

## 2021-05-02 MED ORDER — METHYLPREDNISOLONE SODIUM SUCC 125 MG IJ SOLR
125.0000 mg | Freq: Once | INTRAMUSCULAR | Status: AC
  Filled 2021-05-02: qty 2

## 2021-05-02 MED ORDER — DIPHENHYDRAMINE HCL 50 MG/ML IJ SOLN: 50.0000 mg | Freq: Once | INTRAMUSCULAR | Status: DC | PRN

## 2021-05-02 MED ORDER — ACETAMINOPHEN 325 MG PO TABS
650.0000 mg | ORAL_TABLET | Freq: Once | ORAL | Status: AC
  Administered 2021-05-02: 650 mg via ORAL
  Filled 2021-05-02: qty 2

## 2021-05-02 MED ORDER — FAMOTIDINE IN NACL 20-0.9 MG/50ML-% IV SOLN: 20.0000 mg | Freq: Once | INTRAVENOUS | Status: DC | PRN

## 2021-05-02 MED ORDER — SODIUM CHLORIDE 0.9 % IV SOLN
600.0000 mg | Freq: Once | INTRAVENOUS | Status: AC
  Administered 2021-05-02: 600 mg via INTRAVENOUS
  Filled 2021-05-02: qty 20

## 2021-05-02 MED ORDER — SODIUM CHLORIDE 0.9 % IV SOLN: Freq: Once | INTRAVENOUS | Status: DC | PRN

## 2021-05-02 MED ORDER — ALBUTEROL SULFATE HFA 108 (90 BASE) MCG/ACT IN AERS: 2.0000 | INHALATION_SPRAY | Freq: Once | RESPIRATORY_TRACT | Status: DC | PRN

## 2021-05-02 MED ORDER — METHYLPREDNISOLONE SODIUM SUCC 125 MG IJ SOLR
125.0000 mg | Freq: Once | INTRAMUSCULAR | Status: AC | PRN
  Administered 2021-05-02: 125 mg via INTRAVENOUS

## 2021-05-02 MED ORDER — DIPHENHYDRAMINE HCL 25 MG PO CAPS
50.0000 mg | ORAL_CAPSULE | Freq: Once | ORAL | Status: AC
  Administered 2021-05-02: 50 mg via ORAL
  Filled 2021-05-02: qty 2

## 2021-05-02 MED ORDER — EPINEPHRINE 0.3 MG/0.3ML IJ SOAJ: 0.3000 mg | Freq: Once | INTRAMUSCULAR | Status: DC | PRN

## 2021-05-02 NOTE — Progress Notes (Signed)
Diagnosis: Multiple Sclerosis  Provider:  Chilton Greathouse, MD  Procedure: Infusion  IV Type: Peripheral, IV Location: R Hand  Ocrevus (Ocrelizumab), Dose: 600mg   Infusion Start Time: 0916am  Infusion Stop Time: 1308  Post Infusion IV Care: Patient declined observation and Peripheral IV Discontinued  Discharge: Condition: Good, Destination: Home . AVS provided to patient.   Performed by:  , RN

## 2021-05-07 NOTE — Progress Notes (Signed)
Diagnosis: Multiple Sclerosis  Provider:  Chilton Greathouse, MD  Procedure: Infusion  IV Type: Peripheral, IV Location: R Hand  Ocrevus (Ocrelizumab), Dose: 600mg   Infusion Start Time: 0916am  Infusion Stop Time: 1308  Post Infusion IV Care: Patient declined observation and Peripheral IV Discontinued  Discharge: Condition: Good, Destination: Home . AVS provided to patient.   Performed by:  RN

## 2021-05-23 ENCOUNTER — Other Ambulatory Visit: Payer: Self-pay | Admitting: Neurology

## 2021-06-26 NOTE — Progress Notes (Signed)
NEUROLOGY FOLLOW UP OFFICE NOTE  Joyce Hayes [id number]  Assessment/Plan:   Multiple sclerosis Headache Depression/anxiety   Set up for next Ocrevus infusion in 5 months. continue duloxetine, amitriptyline and olanzapine.  MRI of brain, cervical and thoracic spine with and without contrast in 6 months Check quantitative immunoglobulin panel, CBC with diff, LFTs and vit D level today and again in 6 months (prior to follow up) D3 5000 IU daily Follow up 6 months.     Subjective:  Joyce Hayes is a 66 year old -harightnded female with MS, HTN, asthma and anxiety who presents to follows up for multiple sclerosis.  UPDATE:  Current DMT:  Ocrevus (started 09/17/2017, last dose 05/02/2021) Current medications:  Cymbalta 60mg  QD (pain and depression), amitriptyline 10mg  QHS (sleep, headache and depression), olanzapine 5mg  QHS (sleep, paranoid ideation), diazepam 5mg  BID PRN, D3 5000 IU QD, B12 209mcg QD, CoQ10  MRI Brain with and without contrast on 01/06/2021 personally reviewed demonstrated moderately advanced chronic demyelinating disease limited to the cerebral white matter with no active demyelination.  MRI C-Spine with and without contrast personally reviewed showed subtle evidence of chronic demyelinating disease in the cervical spinal cord at C4-5 level.  12/15/2020 LABS:  IgA 249, IgG 1,601, IgM 34; vit D 71.81.    Vision:  Residual blurred vision in right eye from optic neuritis (approx 2004).  Feels more pressure in the right eye.   Motor:  No issues but sometimes grasping with her hands feels a little weak with overuse.  Sometimes left arm twitches.   Sensory:  No Pain:  Chronic neck pain.  Chronic low back pain, right leg radiculopathy Headaches:  a pounding pain across the forehead.  No nausea, vomiting, photophobia, phonophobia or visual disturbance.  Typically last 30 minutes and occur about once or twice a month.  Treats with Advil.  Often associated with  elevated blood pressure. Gait:  Sometimes feels a little off balance. Bowel/Bladder:  Some urinary incontinence.  History of constipation currently well-managed. Fatigue:  Sometimes feels tired but nothing significant. Cognition:  Mild cognitive disorder. Mood:  Anxiety, depression - comes and goes.  Separated from her husband.  History of paranoid delusions (previously believed that her husband was planning to kill her and that FBI was working with her).  HISTORY: Diagnosed with MS in 2000 presenting with right foot numbness.  Diagnosed via MRI.  Did not have an LP.  Treated with steroids.   Past DMT:  Avonex ([phone], stopped due to flu-like symptoms), Rebif (2001-early 2019 (injection site reaction, depression, lost efficacy) Other past medications:  Wellbutrin   Covid-19 Vaccine:  Moderna - second dose 10/14/2019   Imaging: 11/03/2007 MRI BRAIN:  Reportedly stable compared to prior study from 2007. 06/15/2009 MRI BRAIN:  There are several nonenhancing periventricular white matter and juxtacortical white matter hyperintensities typical of MS.  There is mild atrophy and ventricular enlargement.  Reportedly showed possible minimal progression compared with prior study from 06/20/2005. 12/11/2009 MRI C-SPINE:  Nonenhancing MS lesions, C2 and C4-5.  Minor disc disease. 12/11/2009 MRI T-SPINE:  Nonenhancing MS lesion, T2-3. 08/22/2014 MRI C-SPINE:  Nonenhancing plaque in the dorsal column at C4 and C4-5, unchanged from prior study of 04/21/2012.  The previously described C2 lesion back in 2011 is not seen.  There is a sbutle lesion noted at T2-3. 08/22/2014 MRI T-SPINE:  Limited quality; T2-3 lesion is better seen on C-spine imaging in 2013 and 2016 and appears unchanged. 04/21/2017 MRI BRAIN:  Unchanged from prior study  of 08/22/2014 04/21/2017 MRI C-SPINE:  Unchanged from prior study of 08/22/2014 03/30/2019 MRI BRAIN:  Unchanged from 04/21/2017. 03/30/2019 MRI C-SPINE:  Unchanged from 04/21/2017,  with a stable right dorsal hyperintesity at C4-5 typical of MS.   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  Severe weaknesses in visual spatial and verbal function noted.  Moderate impairment in executive function, attention, problem solving, working memory, and global cognitive score.  Pure memory was only slightly diminished according to [employer].   No family history of MS.  Mother had Alzheimer's disease.  PAST MEDICAL HISTORY: Past Medical History:  Diagnosis Date   Anxiety    Asthma    Hypertension    Multiple sclerosis ([place])     MEDICATIONS: Current Outpatient Medications on File Prior to Visit  Medication Sig Dispense Refill   albuterol (VENTOLIN HFA) 108 (90 Base) MCG/ACT inhaler Inhale into the lungs every 6 (six) hours as needed for wheezing or shortness of breath.     amitriptyline (ELAVIL) 10 MG tablet TAKE 1 TABLET AT BEDTIME 90 tablet 0   bisoprolol-hydrochlorothiazide (ZIAC) 5-6.25 MG tablet 1 tablet     cyanocobalamin 2000 MCG tablet      diazepam (VALIUM) 5 MG tablet Take 1 tablet 40 minutes prior to MRI. 1 tablet 0   DULoxetine (CYMBALTA) 60 MG capsule TAKE 1 CAPSULE EVERY DAY 90 capsule 0   estradiol (ESTRACE) 0.1 MG/GM vaginal cream Place 1 Applicatorful vaginally at bedtime.     Ocrelizumab (OCREVUS IV) Inject into the vein.     OLANZapine (ZYPREXA) 5 MG tablet Take 1 tablet (5 mg total) by mouth at bedtime. 30 tablet 5   No current facility-administered medications on file prior to visit.    ALLERGIES: Allergies  Allergen Reactions   Chocolate    Grass Pollen(K-O-R-T-Swt Vern) Other (See Comments)   Soybean-Containing Drug Products Other (See Comments)    FAMILY HISTORY: Family History  Problem Relation Age of Onset   Dementia Mother    Hypertension Mother    Alzheimer's disease Mother    Hypertension Father    Osteoporosis Father    Hepatitis C Brother        Objective:  Blood pressure 133/63, pulse 64, height 5\' 2"  (1.575 m), weight 144 lb 6.4 oz (65.5 kg), SpO2 98 %. General: No acute distress.  Patient appears well-groomed.   Head:  Normocephalic/atraumatic Eyes:  Fundi examined but not visualized Neck: supple, no paraspinal tenderness, full range of motion Heart:  Regular rate and rhythm Lungs:  Clear to auscultation bilaterally Back: No paraspinal tenderness Neurological Exam: alert and oriented to person, place, and time.  Speech fluent and not dysarthric, language intact.  CN II-XII intact. Bulk and tone normal, muscle strength 5/5 throughout.  Sensation to vibration and pinprick intact.  Deep tendon reflexes 2+ throughout, toes downgoing.  Finger to nose testing intact.  Gait unsteady.  Romberg negative   Metta Clines, DO  CC: Leota Sauers, MD

## 2021-06-27 ENCOUNTER — Encounter: Payer: Self-pay | Admitting: Neurology

## 2021-06-27 ENCOUNTER — Other Ambulatory Visit (INDEPENDENT_AMBULATORY_CARE_PROVIDER_SITE_OTHER): Payer: Medicare PPO

## 2021-06-27 ENCOUNTER — Other Ambulatory Visit: Payer: Self-pay

## 2021-06-27 ENCOUNTER — Ambulatory Visit: Payer: Medicare PPO | Admitting: Neurology

## 2021-06-27 VITALS — BP 133/63 | HR 64 | Ht 62.0 in | Wt 144.4 lb

## 2021-06-27 DIAGNOSIS — G35 Multiple sclerosis: Secondary | ICD-10-CM

## 2021-06-27 DIAGNOSIS — F32A Depression, unspecified: Secondary | ICD-10-CM | POA: Diagnosis not present

## 2021-06-27 DIAGNOSIS — F419 Anxiety disorder, unspecified: Secondary | ICD-10-CM | POA: Diagnosis not present

## 2021-06-27 DIAGNOSIS — R519 Headache, unspecified: Secondary | ICD-10-CM

## 2021-06-27 LAB — VITAMIN D 25 HYDROXY (VIT D DEFICIENCY, FRACTURES): VITD: 107.22 ng/mL (ref 30.00–100.00)

## 2021-06-27 LAB — HEPATIC FUNCTION PANEL
ALT: 9 U/L (ref 0–35)
AST: 18 U/L (ref 0–37)
Albumin: 4.4 g/dL (ref 3.5–5.2)
Alkaline Phosphatase: 61 U/L (ref 39–117)
Bilirubin, Direct: 0.1 mg/dL (ref 0.0–0.3)
Total Bilirubin: 0.7 mg/dL (ref 0.2–1.2)
Total Protein: 7.2 g/dL (ref 6.0–8.3)

## 2021-06-27 NOTE — Addendum Note (Signed)
Addended by: Leida Lauth on: 06/27/2021 10:25 AM   Modules accepted: Orders

## 2021-06-27 NOTE — Progress Notes (Signed)
Patient advised of her lab results. Decrease D3 to 4000 IU daily.

## 2021-06-27 NOTE — Patient Instructions (Addendum)
Check CBC with diff, quantitative immunoglobulin panel, LFTs, vit D today and again in 6 months Repeat MRI of brain/cervical/thoracic spine with and without contrast in 6 months Continue duloxetine, amitriptyline, olanzapine and vit D3 5000 IU daily Follow up 6 months.

## 2021-06-28 LAB — CBC WITH DIFFERENTIAL
Basophils Absolute: 0.1 10*3/uL (ref 0.0–0.2)
Basos: 1 %
EOS (ABSOLUTE): 0.3 10*3/uL (ref 0.0–0.4)
Eos: 6 %
Hematocrit: 41.8 % (ref 34.0–46.6)
Hemoglobin: 13.7 g/dL (ref 11.1–15.9)
Immature Grans (Abs): 0 10*3/uL (ref 0.0–0.1)
Immature Granulocytes: 0 %
Lymphocytes Absolute: 1 10*3/uL (ref 0.7–3.1)
Lymphs: 18 %
MCH: 30.2 pg (ref 26.6–33.0)
MCHC: 32.8 g/dL (ref 31.5–35.7)
MCV: 92 fL (ref 79–97)
Monocytes Absolute: 0.7 10*3/uL (ref 0.1–0.9)
Monocytes: 14 %
Neutrophils Absolute: 3.2 10*3/uL (ref 1.4–7.0)
Neutrophils: 61 %
RBC: 4.53 x10E6/uL (ref 3.77–5.28)
RDW: 12.4 % (ref 11.7–15.4)
WBC: 5.3 10*3/uL (ref 3.4–10.8)

## 2021-06-28 LAB — IGG, IGA, IGM
IgG (Immunoglobin G), Serum: 1442 mg/dL (ref 600–1540)
IgM, Serum: 29 mg/dL — ABNORMAL LOW (ref 50–300)
Immunoglobulin A: 250 mg/dL (ref 70–320)

## 2021-07-16 ENCOUNTER — Encounter: Payer: Self-pay | Admitting: Neurology

## 2021-07-16 ENCOUNTER — Telehealth: Payer: Self-pay | Admitting: Neurology

## 2021-07-16 NOTE — Telephone Encounter (Signed)
Patient recvd mail stating the MRI was approved for 07/09/21-02/22-23. She said jaffe didn't want her to have the MRI that soon , she doesn't know what to do.

## 2021-07-16 NOTE — Telephone Encounter (Signed)
Telephone call to [facility], Please have PA changed to 6 months out..  Will advised PA staff to have it changed.

## 2021-07-17 ENCOUNTER — Telehealth: Payer: Self-pay | Admitting: Neurology

## 2021-07-17 NOTE — Telephone Encounter (Signed)
Patient called about her MRI authorization.   She said it was only authorized for one month but she needs the MRI in six month.

## 2021-07-18 NOTE — Telephone Encounter (Signed)
Pt advised [facility] is working.   Please call Them for updates.

## 2021-07-23 ENCOUNTER — Telehealth: Payer: Self-pay | Admitting: Pharmacy Technician

## 2021-07-23 NOTE — Telephone Encounter (Addendum)
Auth Submission: PENDING Payer: [employer] Medication & CPT/J Code(s) submitted: Ocrevus Hoyt Koch) [phone] Route of submission (phone, fax, portal): PHONE Auth type: Buy/Bill Units/visits requested: 2 Reference number: [id number] Case ID# [id number]    Fyi note: Patient is enrolled in the [place] (free drug) [phone]. Approved 10/13/20 - 10/14/23  Will update once we receive a response.

## 2021-07-24 NOTE — Telephone Encounter (Addendum)
Auth Submission: APPROVED Payer: [employer] Medication & CPT/J Code(s) submitted: De Nurse Hoyt Koch) [phone] Route of submission (phone, fax, portal): COVER MY MEDS Auth type: Buy/Bill Units/visits requested: 600MG  Q 180 DAYS Reference number:  Approval from: 10/13/20 to 06/17/23.  Auth extended on 04/11/22. Approval dates 10/13/20 - 06/17/23

## 2021-07-26 ENCOUNTER — Telehealth: Payer: Self-pay | Admitting: Neurology

## 2021-07-26 NOTE — Telephone Encounter (Signed)
Pt called in and left a message needing authorization. I called the patient and found out it was for an MRI. I let her know [facility] would be doing the authorization.

## 2021-08-27 ENCOUNTER — Telehealth: Payer: Self-pay | Admitting: Neurology

## 2021-08-27 NOTE — Telephone Encounter (Signed)
Pt is sch to have an MRI done and needs Valium for her to be able to have the MRI.  She has 2 MRI's she is doing. Her pharmacy is [facility] ?

## 2021-08-28 ENCOUNTER — Other Ambulatory Visit: Payer: Self-pay | Admitting: Neurology

## 2021-08-28 MED ORDER — DIAZEPAM 5 MG PO TABS: ORAL_TABLET | ORAL | 0 refills | Status: DC

## 2021-08-28 NOTE — Telephone Encounter (Signed)
Called patient and informed her that Valium was sent to pharmacy and she will need a driver to and from her MRI. Patient verbalized understanding. ?

## 2021-09-05 ENCOUNTER — Telehealth: Payer: Self-pay | Admitting: Neurology

## 2021-09-05 ENCOUNTER — Ambulatory Visit
Admission: RE | Admit: 2021-09-05 | Discharge: 2021-09-05 | Disposition: A | Discharge status: Home / Self Care | Payer: Medicare PPO | Source: Ambulatory Visit | Attending: Neurology | Admitting: Neurology

## 2021-09-05 ENCOUNTER — Other Ambulatory Visit: Payer: Self-pay

## 2021-09-05 ENCOUNTER — Other Ambulatory Visit: Payer: Self-pay | Admitting: Neurology

## 2021-09-05 DIAGNOSIS — G35 Multiple sclerosis: Secondary | ICD-10-CM

## 2021-09-05 IMAGING — MR MR HEAD WO/W CM
20 series · 43 of 48 positions shown · IV contrast (13 ml multihance)
Comparison: MR head in cervical spine without and with contrast
[DATE].

CLINICAL DATA: Multiple sclerosis.

EXAM:
MRI HEAD WITHOUT AND WITH CONTRAST
MRI CERVICAL SPINE WITHOUT AND WITH CONTRAST
TECHNIQUE: Multiplanar, multiecho pulse sequences of the brain and surrounding
structures, and cervical spine, to include the craniocervical
junction and cervicothoracic junction, were obtained without and
with intravenous contrast.
CONTRAST:  13mL MULTIHANCE GADOBENATE DIMEGLUMINE 529 MG/ML IV SOLN

[Series 2: T1 · sagittal · 5.0mm · 0.45mm/px · 2 of 25 slices shown (1 of 3)]
[im 1/25]
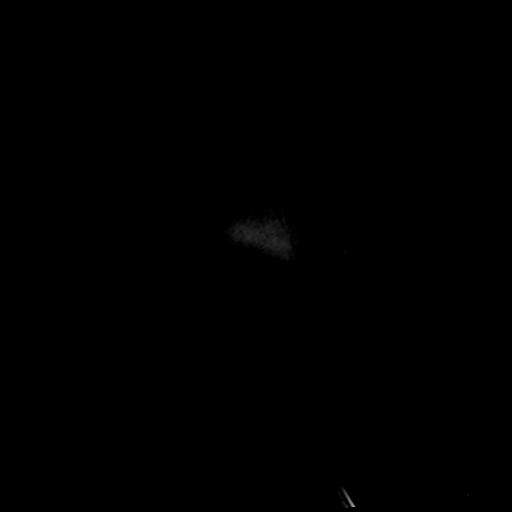
[im 25/25]
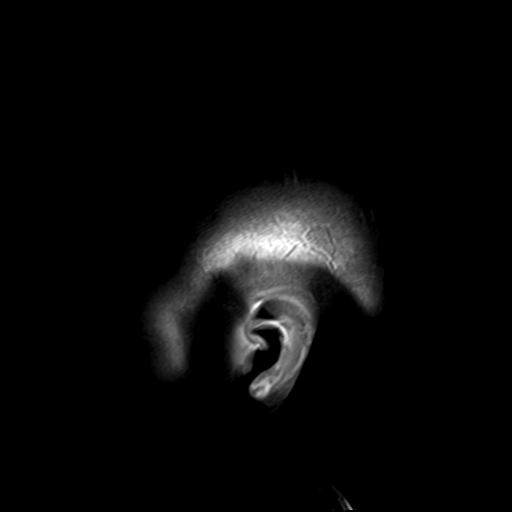

[Series 3: ax ep2d_diff_3 · axial · 3.0mm · 1.80mm/px · z∈[-93,+69]mm · 6 of 108 slices shown]
[im 1/108]
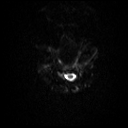
[im 22/108]
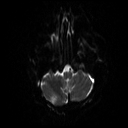
[im 43/108]
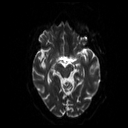
[im 65/108]
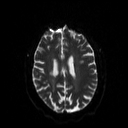
[im 86/108]
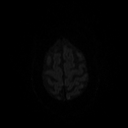
[im 108/108]
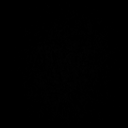

[Series 4: ax ep2d_diff_3_adc · axial · 3.0mm · 1.80mm/px · z∈[-93,+69]mm · 3 of 55 slices shown]
[im 1/55]
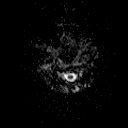
[im 28/55]
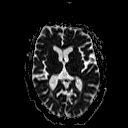
[im 55/55]
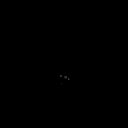

[Series 5: cor ep2d_diff · coronal · 5.0mm · 1.77mm/px · 3 of 59 slices shown]
[im 1/59]
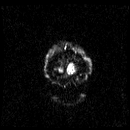
[im 30/59]
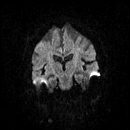
[im 59/59]
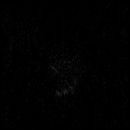

[Series 6: cor ep2d_diff_adc · coronal · 5.0mm · 1.77mm/px · 1 of 30 slices shown]
[im 1/30]
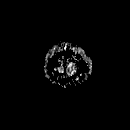

[Series 8: swi_images · axial · 2.0mm · 0.98mm/px · z∈[-90,+67]mm · 4 of 80 slices shown]
[im 1/80]
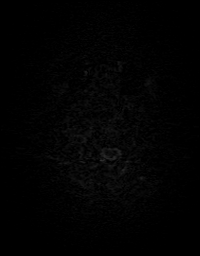
[im 27/80]
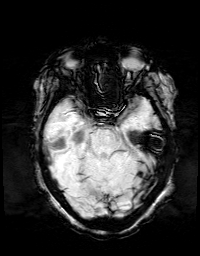
[im 53/80]
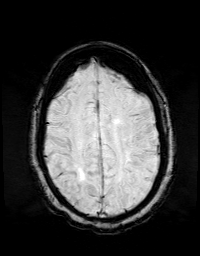
[im 80/80]
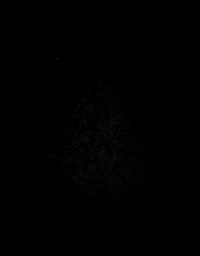

[Series 9: FLAIR · axial · 3.0mm · 0.43mm/px · z∈[-88,+64]mm · 2 of 40 slices shown]
[im 1/40]
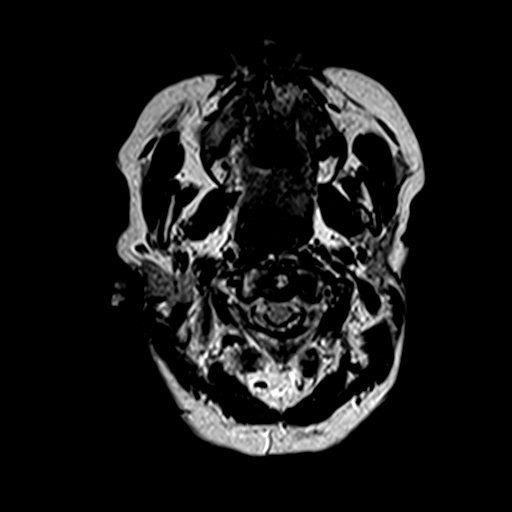
[im 40/40]
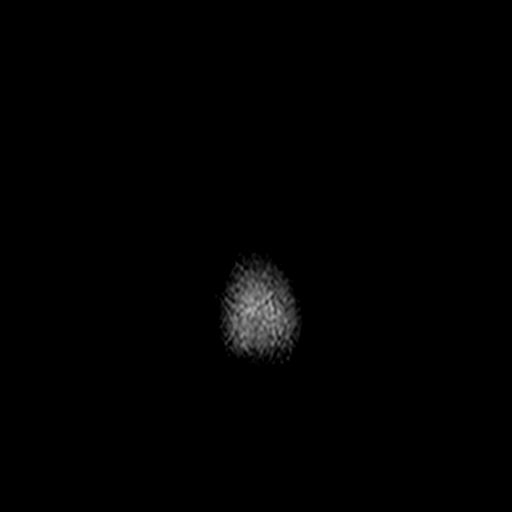

[Series 10: T2 · axial · 5.0mm · 0.65mm/px · 1 of 29 slices shown (1 of 4)]
[im 1/29]
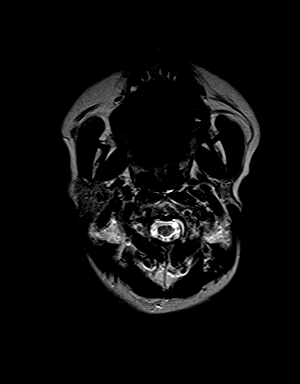

[Series 11: t1_mpr_tra · axial · 1.0mm · 0.72mm/px · z∈[-91,+68]mm · 8 of 160 slices shown]
[im 1/160]
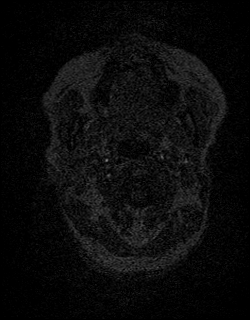
[im 23/160]
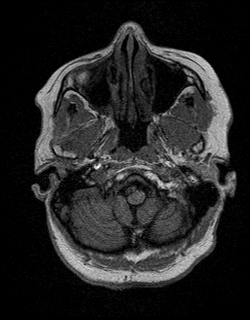
[im 46/160]
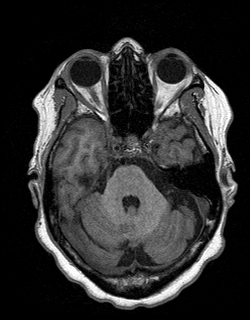
[im 69/160]
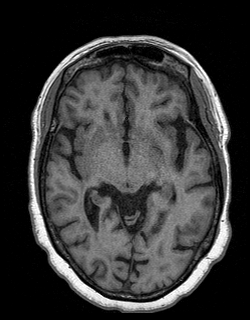
[im 91/160]
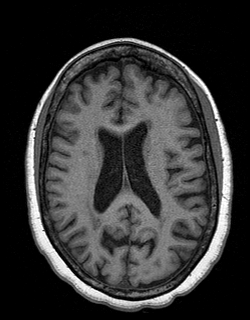
[im 114/160]
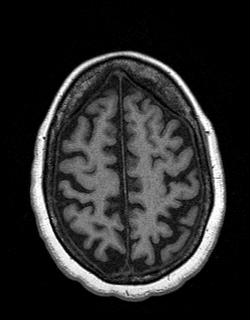
[im 137/160]
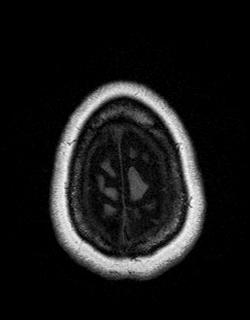
[im 160/160]
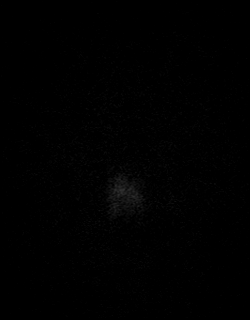

[Series 12: T2 · coronal · 5.0mm · 0.43mm/px · 1 of 28 slices shown (2 of 4)]
[im 1/28]
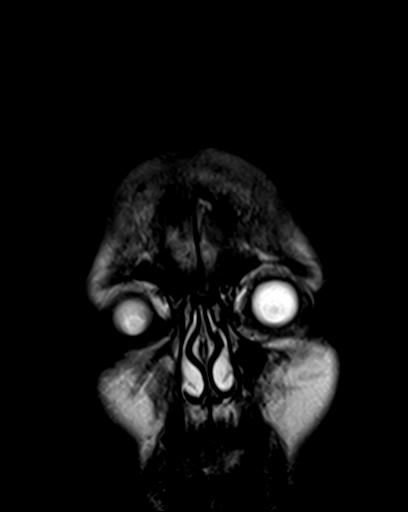

[Series 14: T2 · sagittal · 3.0mm · 0.66mm/px · 1 of 15 slices shown (3 of 4)]
[im 1/15]
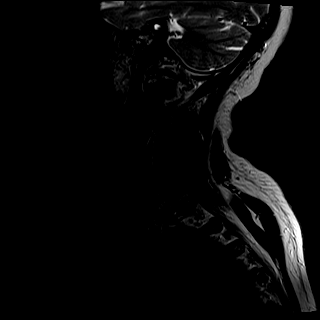

[Series 15: T1 · sagittal · 3.0mm · 0.41mm/px · 1 of 15 slices shown (2 of 3)]
[im 1/15]
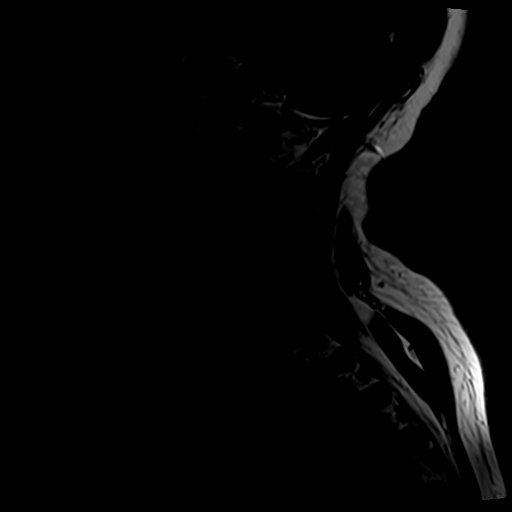

[Series 16: tir sag · sagittal · 3.0mm · 0.41mm/px · 1 of 15 slices shown]
[im 1/15]
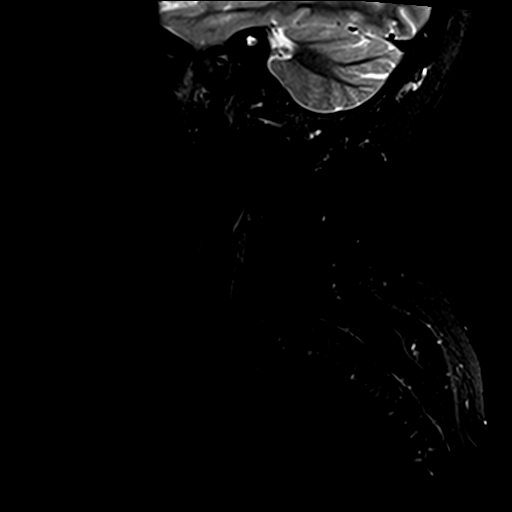

[Series 17: GRE · axial · 3.0mm · 0.35mm/px · 1 of 27 slices shown]
[im 1/27]
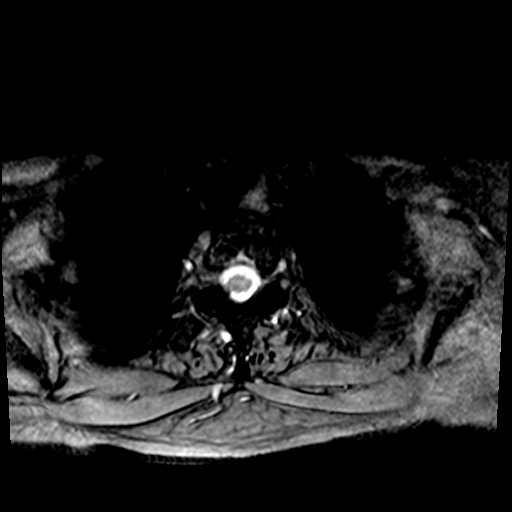

[Series 18: T2 · axial · 3.0mm · 0.70mm/px · 1 of 27 slices shown (4 of 4)]
[im 1/27]
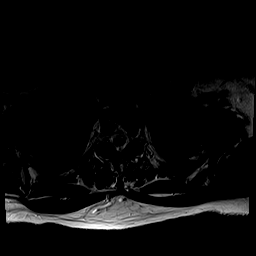

[Series 19: T1 · axial · non-contrast · 3.0mm · 0.35mm/px · 1 of 27 slices shown (3 of 3)]
[im 1/27]
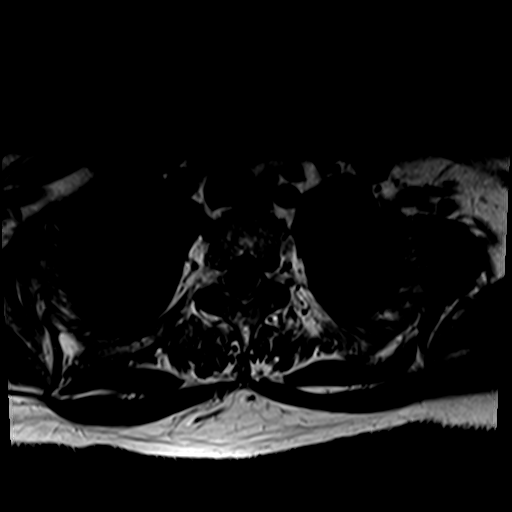

[Series 20: T1 post-contrast · axial · 3.0mm · 0.35mm/px · 1 of 27 slices shown (1 of 2)]
[im 1/27]
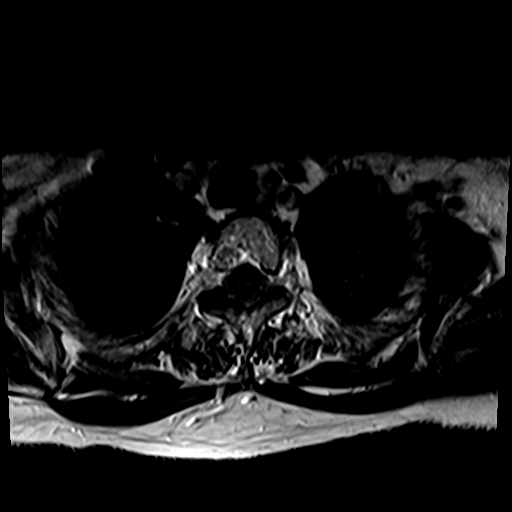

[Series 21: T1 fat-sat post-contrast · sagittal · 3.0mm · 0.82mm/px · 1 of 15 slices shown]
[im 1/15]
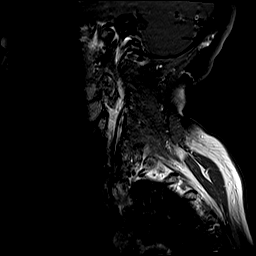

[Series 22: post t1_mpr_tra · axial · 1.0mm · 0.72mm/px · z∈[-91,-46]mm · 3 of 160 slices shown]
[im 1/160]
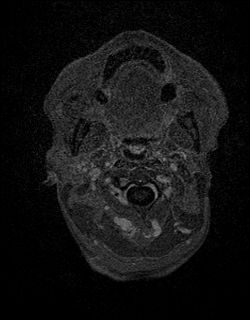
[im 23/160]
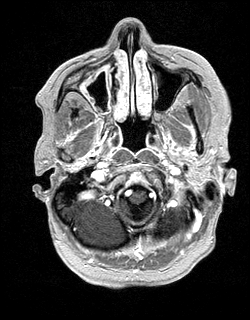
[im 46/160]
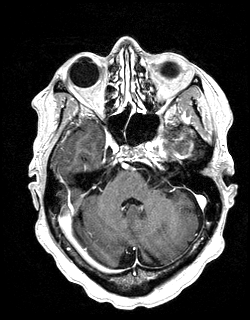

[Series 23: T1 post-contrast · coronal · 5.0mm · 0.72mm/px · 1 of 26 slices shown (2 of 2)]
[im 1/26]
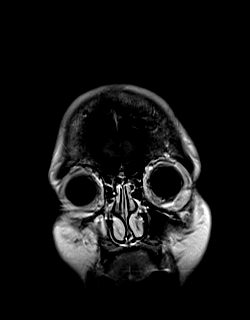

[43 of 48 positions shown; findings below may reference images not displayed]

FINDINGS: MRI HEAD FINDINGS

Brain: Predominately periventricular T2 hyperintensities are stable
prior exam. Volume loss is noted, particularly about the atria of
the lateral ventricles.

No foci of restricted diffusion or enhancement is present to suggest
active demyelination.

Postcontrast images demonstrate no pathologic enhancement.

Mildly dilated common portion into the degree of volume loss. No
significant extraaxial fluid collection is present.

The internal auditory canals are within normal limits. The brainstem
and cerebellum are within normal limits.

Vascular: Flow is present in the major intracranial arteries.

Skull and upper cervical spine: Craniocervical junction is normal.
Flow is present in the vertebral arteries bilaterally. Visualized
intracranial contents are normal.

Sinuses/Orbits: Circumferential mucosal thickening is present within
a shrunken right maxillary sinus. Chronic fluid noted. A polyp or
mucous retention cyst is present in the inferior left maxillary
sinus. Chronic right mastoid effusion is noted. The paranasal
sinuses and left mastoid air cells are otherwise clear. Bilateral
lens replacements are noted. Globes and orbits are otherwise
unremarkable.

MRI CERVICAL SPINE FINDINGS

Alignment: No significant listhesis is present. Lordosis is
preserved.

Vertebrae: Is marrow signal is stable. No new or enhancing lesions
are present.

Cord: Vague T2 hyperintensity at C4-5 again noted. No associated
enhancement. No other focal T2 hyperintensity present in the cord to
the lowest imaged level, T3-4.

No pathologic enhancement is present.

Posterior Fossa, vertebral arteries, paraspinal tissues:
Craniocervical junction is normal. Flow is present in the vertebral
arteries bilaterally. Visualized intracranial contents are normal.

Disc levels: C2-3: Negative.

C3-4: Mild facet hypertrophy is present bilaterally. No significant
stenosis or change

C4-5: Uncovertebral spurring contributes to stable moderate
foraminal narrowing bilaterally.

C5-6: Uncovertebral spurring contributes to stable moderate
foraminal narrowing, right greater than left.

C6-7: Negative.

C7-T1 negative.
IMPRESSION: 1. Stable appearance of diffuse periventricular T2 hyperintensities
bilaterally compatible with the given diagnosis of multiple
sclerosis. No evidence for active demyelination.
2. No acute intracranial abnormality.
3. Stable focal T2 hyperintensity at C4-5 without associated
enhancement.
4. No new T2 hyperintense lesions within the cervical or upper
thoracic spine.
5. Stable moderate foraminal narrowing bilaterally at C4-5 and C5-6.
6. Chronic right maxillary sinus disease and right mastoid effusion.

## 2021-09-05 MED ORDER — DIAZEPAM 5 MG PO TABS: ORAL_TABLET | ORAL | 0 refills | Status: DC

## 2021-09-05 MED ORDER — GADOBENATE DIMEGLUMINE 529 MG/ML IV SOLN
13.0000 mL | Freq: Once | INTRAVENOUS | Status: AC | PRN
  Administered 2021-09-05: 13 mL via INTRAVENOUS

## 2021-09-05 NOTE — Telephone Encounter (Signed)
Pt called in and left a message. She needs Valium sent in for her MRI on 09/11/21 ?

## 2021-09-11 ENCOUNTER — Other Ambulatory Visit: Payer: Self-pay

## 2021-09-11 ENCOUNTER — Ambulatory Visit
Admission: RE | Admit: 2021-09-11 | Discharge: 2021-09-11 | Disposition: A | Discharge status: Home / Self Care | Payer: Medicare PPO | Source: Ambulatory Visit | Attending: Neurology | Admitting: Neurology

## 2021-09-11 DIAGNOSIS — G35 Multiple sclerosis: Secondary | ICD-10-CM

## 2021-09-11 IMAGING — MR MR THORACIC SPINE WO/W CM
4 of 8 series · 18 of 48 positions shown · IV contrast (multihance)
Comparison: No direct comparison study available. Cervical spine
MRI [DATE]

CLINICAL DATA: Multiple sclerosis new onset left arm shaking

EXAM:
MRI THORACIC WITHOUT AND WITH CONTRAST
TECHNIQUE: Multiplanar and multiecho pulse sequences of the thoracic spine were
obtained without and with intravenous contrast.
CONTRAST:  13mL MULTIHANCE GADOBENATE DIMEGLUMINE 529 MG/ML IV SOLN

[Series 5: T1 · sagittal · 3.0mm · 0.55mm/px · 3 of 17 slices shown (1 of 2)]
[im 1/17]
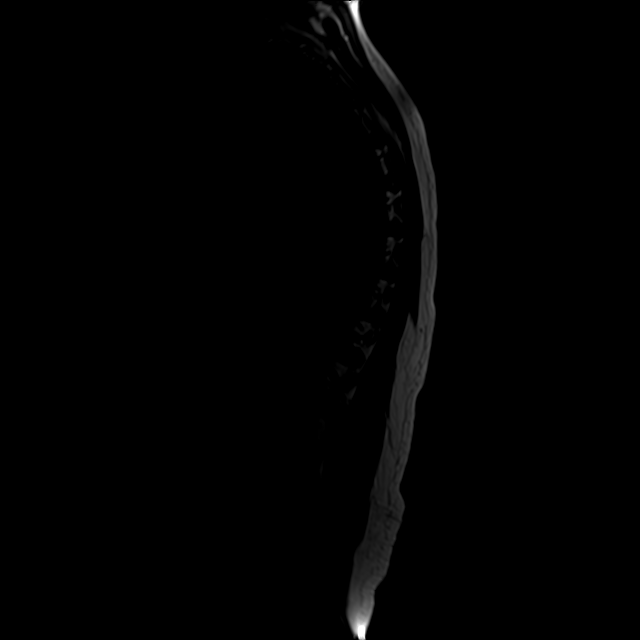
[im 11/17]
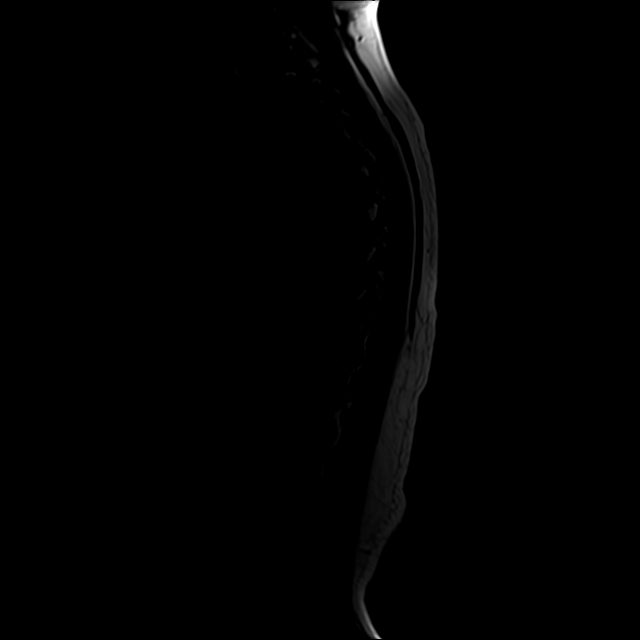
[im 17/17]
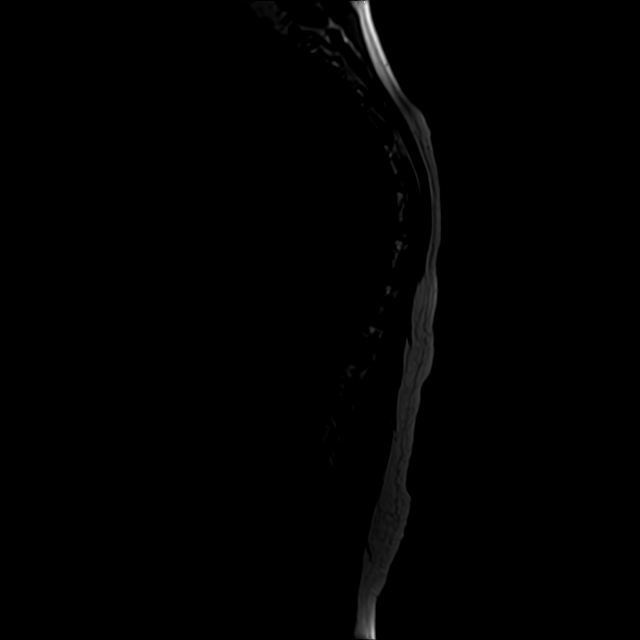

[Series 6: T2 · axial · 4.0mm · 0.39mm/px · z∈[-198,-35]mm · 8 of 39 slices shown]
[im 1/39]
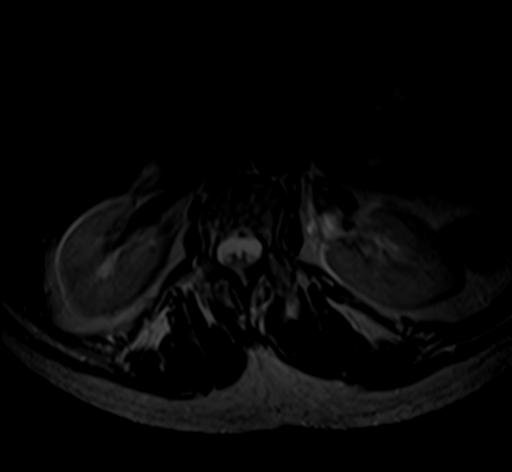
[im 6/39]
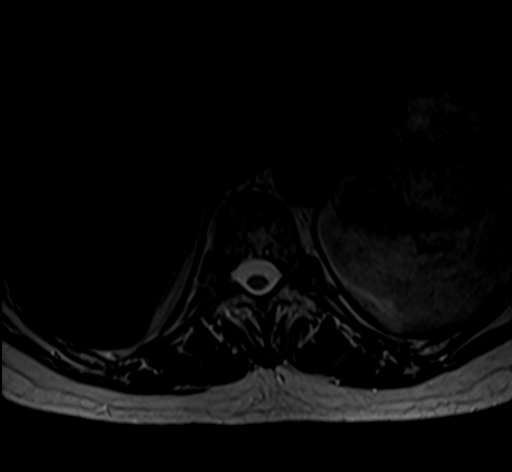
[im 11/39]
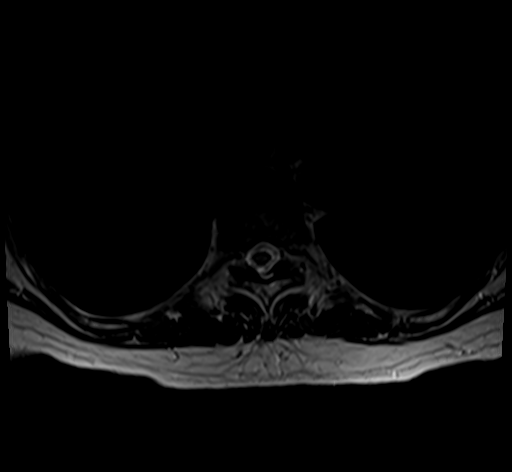
[im 17/39]
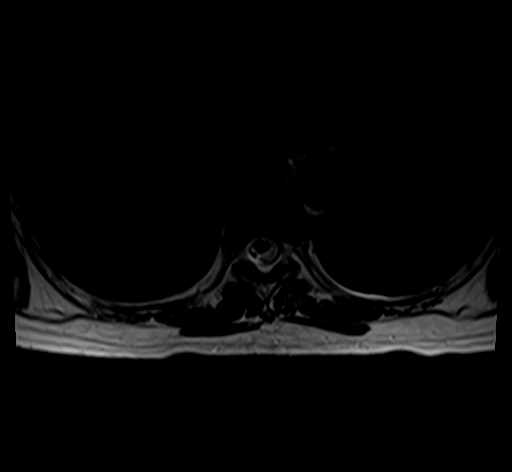
[im 22/39]
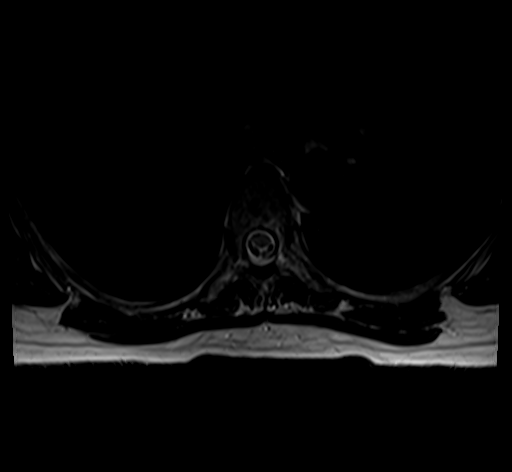
[im 28/39]
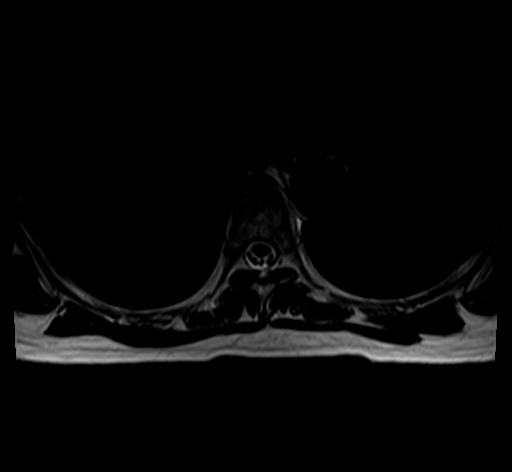
[im 33/39]
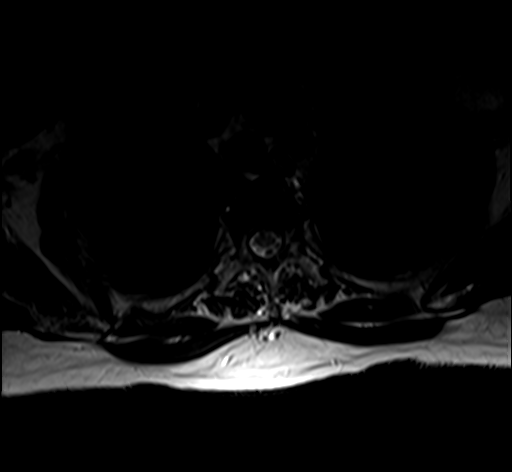
[im 39/39]
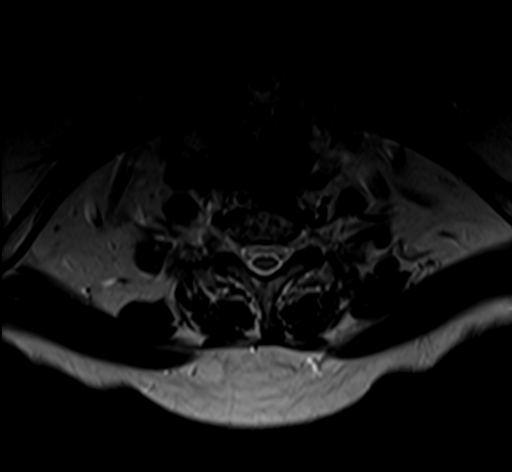

[Series 8: T1 · axial · 4.0mm · 0.78mm/px · z∈[-157,-52]mm · 3 of 39 slices shown (2 of 2)]
[im 6/39]
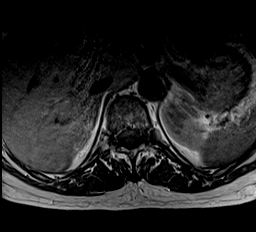
[im 22/39]
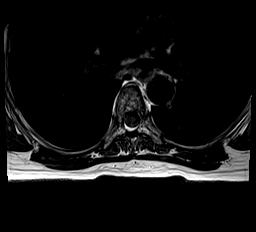
[im 33/39]
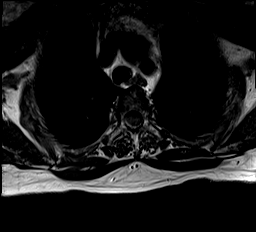

[Series 9: T2 post-contrast · sagittal · 3.0mm · 0.55mm/px · 4 of 17 slices shown]
[im 1/17]
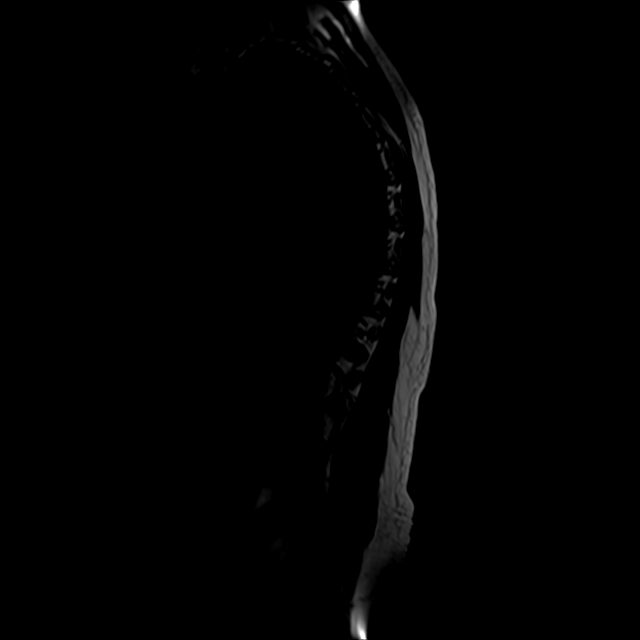
[im 6/17]
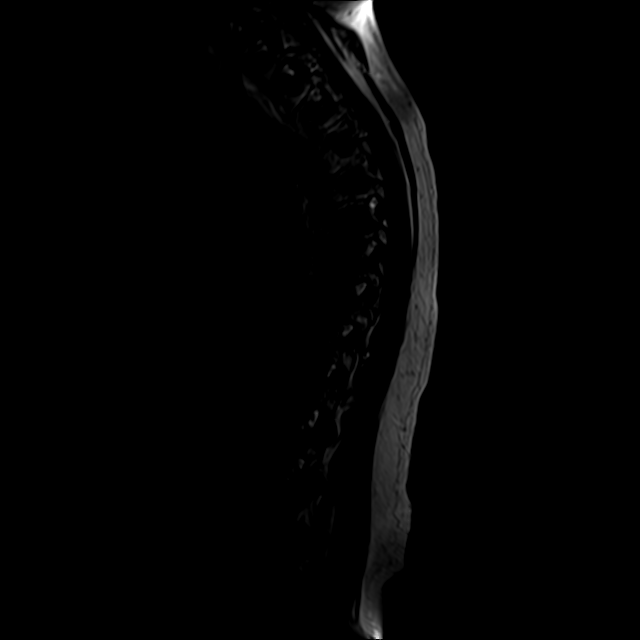
[im 11/17]
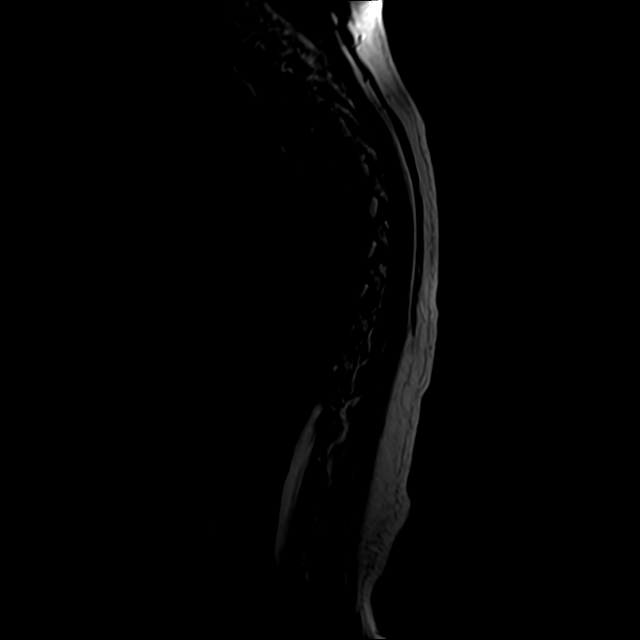
[im 17/17]
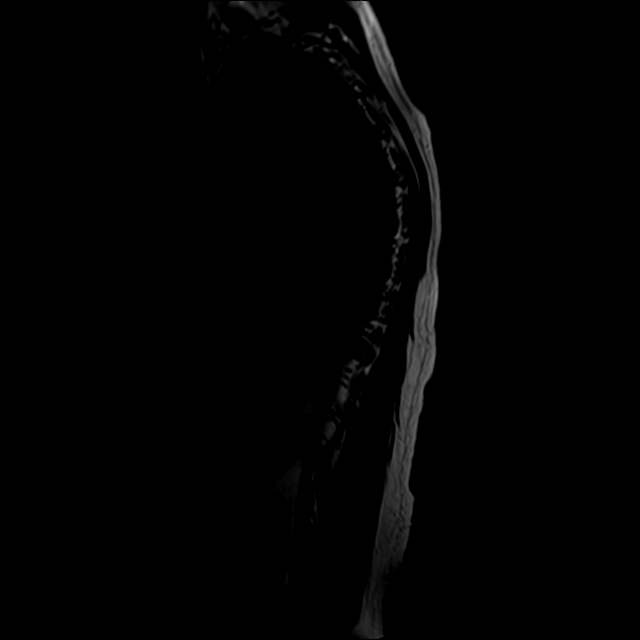

[18 of 48 positions shown; findings below may reference images not displayed]

FINDINGS: Alignment: There is slightly exaggerated thoracic kyphosis. There is
no antero or retrolisthesis.

Vertebrae: Marrow signal is diffusely heterogeneous, similar to the
cervical spine MRI from [DATE]. A small focus of T1
hyperintensity in the T6 vertebral body likely reflects an
intraosseous hemangioma. There is no focal suspicious marrow signal
abnormality. There is no marrow edema or enhancement.

Cord: The thoracic cord is normal in signal and morphology. There is
no convincing evidence of demyelination in the thoracic cord. There
is no abnormal cord enhancement.

Paraspinal and other soft tissues: Unremarkable.

Disc levels:

There is multilevel disc desiccation and narrowing in the thoracic
spine, most advanced at T5-T6 through T8-T9. There is mild
multilevel degenerative endplate change and facet arthropathy
throughout the thoracic spine. There are small disc protrusions at
T6-T7, T9-T10, and T10-T11 with mild bilateral neural foraminal
stenosis at T10-T11. There is no high-grade spinal canal or neural
foraminal stenosis.
IMPRESSION: No convincing evidence of demyelinating lesions in the thoracic
cord.

## 2021-09-11 MED ORDER — GADOBENATE DIMEGLUMINE 529 MG/ML IV SOLN
13.0000 mL | Freq: Once | INTRAVENOUS | Status: AC | PRN
  Administered 2021-09-11: 13 mL via INTRAVENOUS

## 2021-09-12 NOTE — Progress Notes (Signed)
Pt advised of her MRI results.

## 2021-10-17 ENCOUNTER — Other Ambulatory Visit: Payer: Self-pay | Admitting: Neurology

## 2021-10-30 ENCOUNTER — Ambulatory Visit (INDEPENDENT_AMBULATORY_CARE_PROVIDER_SITE_OTHER): Payer: Medicare PPO

## 2021-10-30 VITALS — BP 158/77 | HR 88 | Temp 97.6°F | Resp 18 | Ht 62.0 in | Wt 148.6 lb

## 2021-10-30 DIAGNOSIS — G35 Multiple sclerosis: Secondary | ICD-10-CM | POA: Diagnosis not present

## 2021-10-30 MED ORDER — ACETAMINOPHEN 325 MG PO TABS
650.0000 mg | ORAL_TABLET | Freq: Once | ORAL | Status: AC
  Administered 2021-10-30: 650 mg via ORAL
  Filled 2021-10-30: qty 2

## 2021-10-30 MED ORDER — DIPHENHYDRAMINE HCL 25 MG PO CAPS
50.0000 mg | ORAL_CAPSULE | Freq: Once | ORAL | Status: AC
  Administered 2021-10-30: 50 mg via ORAL
  Filled 2021-10-30: qty 2

## 2021-10-30 MED ORDER — METHYLPREDNISOLONE SODIUM SUCC 125 MG IJ SOLR
125.0000 mg | Freq: Once | INTRAMUSCULAR | Status: AC
  Administered 2021-10-30: 125 mg via INTRAVENOUS
  Filled 2021-10-30: qty 2

## 2021-10-30 MED ORDER — SODIUM CHLORIDE 0.9 % IV SOLN
600.0000 mg | Freq: Once | INTRAVENOUS | Status: AC
  Administered 2021-10-30: 600 mg via INTRAVENOUS
  Filled 2021-10-30: qty 20

## 2021-10-30 NOTE — Progress Notes (Signed)
Diagnosis: Multiple Sclerosis ? ?Provider:  Chilton Greathouse, MD ? ?Procedure: Infusion ? ?IV Type: Peripheral, IV Location: R Forearm ? ?Ocrevus (Ocrelizumab), Dose: 600mg  ? ?Infusion Start Time: 0909 ? ?Infusion Stop Time: 1300 ? ?Post Infusion IV Care: Patient declined observation and Peripheral IV Discontinued ? ?Discharge: Condition: Good, Destination: Home . AVS provided to patient.  ? ?Performed by:  [id number], RN  ?  ?

## 2021-12-19 ENCOUNTER — Other Ambulatory Visit: Payer: Self-pay

## 2021-12-19 ENCOUNTER — Other Ambulatory Visit: Payer: Medicare PPO

## 2021-12-19 DIAGNOSIS — G35 Multiple sclerosis: Secondary | ICD-10-CM

## 2021-12-19 LAB — HEPATIC FUNCTION PANEL
ALT: 10 U/L (ref 0–35)
AST: 21 U/L (ref 0–37)
Albumin: 4.3 g/dL (ref 3.5–5.2)
Alkaline Phosphatase: 56 U/L (ref 39–117)
Bilirubin, Direct: 0.1 mg/dL (ref 0.0–0.3)
Total Bilirubin: 0.4 mg/dL (ref 0.2–1.2)
Total Protein: 7.1 g/dL (ref 6.0–8.3)

## 2021-12-19 LAB — VITAMIN D 25 HYDROXY (VIT D DEFICIENCY, FRACTURES): VITD: 72.88 ng/mL (ref 30.00–100.00)

## 2021-12-19 NOTE — Progress Notes (Signed)
Per last ov note patient to get,  Check quantitative immunoglobulin panel, CBC with diff, LFTs and vit D level today and again in 6 months (prior to follow up)

## 2021-12-20 ENCOUNTER — Other Ambulatory Visit: Payer: Medicare PPO

## 2021-12-20 LAB — CBC WITH DIFFERENTIAL
Basophils Absolute: 0 10*3/uL (ref 0.0–0.2)
Basos: 1 %
EOS (ABSOLUTE): 0.2 10*3/uL (ref 0.0–0.4)
Eos: 4 %
Hematocrit: 40.2 % (ref 34.0–46.6)
Hemoglobin: 12.9 g/dL (ref 11.1–15.9)
Immature Grans (Abs): 0 10*3/uL (ref 0.0–0.1)
Immature Granulocytes: 0 %
Lymphocytes Absolute: 1.1 10*3/uL (ref 0.7–3.1)
Lymphs: 18 %
MCH: 30.4 pg (ref 26.6–33.0)
MCHC: 32.1 g/dL (ref 31.5–35.7)
MCV: 95 fL (ref 79–97)
Monocytes Absolute: 0.7 10*3/uL (ref 0.1–0.9)
Monocytes: 12 %
Neutrophils Absolute: 3.9 10*3/uL (ref 1.4–7.0)
Neutrophils: 65 %
RBC: 4.24 x10E6/uL (ref 3.77–5.28)
RDW: 12.8 % (ref 11.7–15.4)
WBC: 5.9 10*3/uL (ref 3.4–10.8)

## 2021-12-20 LAB — IGG, IGA, IGM
IgG (Immunoglobin G), Serum: 1390 mg/dL (ref 600–1540)
IgM, Serum: 26 mg/dL — ABNORMAL LOW (ref 50–300)
Immunoglobulin A: 208 mg/dL (ref 70–320)

## 2021-12-25 ENCOUNTER — Other Ambulatory Visit: Payer: Medicare PPO

## 2021-12-25 NOTE — Progress Notes (Unsigned)
NEUROLOGY FOLLOW UP OFFICE NOTE  Joyce Hayes [id number]  Assessment/Plan:   Multiple sclerosis Headache Depression/anxiety   Set up for next Ocrevus infusion in 5 months. continue duloxetine, amitriptyline and olanzapine.  MRI of brain, cervical and thoracic spine with and without contrast in 6 months Check quantitative immunoglobulin panel, CBC with diff, LFTs and vit D level today and again in 6 months (prior to follow up) D3 5000 IU daily Follow up 6 months.     Subjective:  Joyce Hayes is a 66 year old -harightnded female with MS, HTN, asthma and anxiety who follows up for multiple sclerosis.   UPDATE: Current DMT:  Ocrevus (started 09/17/2017) Current medications:  Cymbalta 60mg  QD (pain and depression), amitriptyline 10mg  QHS (sleep, headache and depression), olanzapine 5mg  QHS (sleep, paranoid ideation), diazepam 5mg  BID PRN, D3 5000 IU QD, B12 QD, CoQ10   MRIs personally reviewed: 09/05/2021 MRI BRAIN W WO:  Stable compared to 12/2020 09/05/2021 MRI C-SPINE W WO:  Stable compared to 12/2020   12/19/2021 LABS:  CBC with WBC 5.9, HGB 12.9, HCT 40.2, ALC 1.1; Hepatic panel with t bili 0.4, ALP 56, AST 21, ALT 10; Quantitative immunoglobulin panel with IgA 208, IgG 1390, IgM 26; Vit D 72.88     Vision:  Residual blurred vision in right eye from optic neuritis (approx 2004).  Feels more pressure in the right eye.   Motor:  No issues but sometimes grasping with her hands feels a little weak with overuse.  Sometimes left arm twitches.   Sensory:  No Pain:  Chronic neck pain.  Chronic low back pain, right leg radiculopathy Headaches:  a pounding pain across the forehead.  No nausea, vomiting, photophobia, phonophobia or visual disturbance.  Typically last 30 minutes and occur about once or twice a month.  Treats with Advil.  Often associated with elevated blood pressure. Gait:  Sometimes feels a little off balance. Bowel/Bladder:  Some urinary incontinence.   History of constipation currently well-managed. Fatigue:  Sometimes feels tired but nothing significant. Cognition:  Mild cognitive disorder. Mood:  Anxiety, depression - comes and goes.  Separated from her husband.  History of paranoid delusions (previously believed that her husband was planning to kill her and that FBI was working with her).   HISTORY: Diagnosed with MS in 2000 presenting with right foot numbness.  Diagnosed via MRI.  Did not have an LP.  Treated with steroids.   Past DMT:  Avonex ([phone], stopped due to flu-like symptoms), Rebif (2001-early 2019 (injection site reaction, depression, lost efficacy) Other past medications:  Wellbutrin   Covid-19 Vaccine:  Moderna - second dose 10/14/2019   Imaging: 11/03/2007 MRI BRAIN:  Reportedly stable compared to prior study from 2007. 06/15/2009 MRI BRAIN:  There are several nonenhancing periventricular white matter and juxtacortical white matter hyperintensities typical of MS.  There is mild atrophy and ventricular enlargement.  Reportedly showed possible minimal progression compared with prior study from 06/20/2005. 12/11/2009 MRI C-SPINE:  Nonenhancing MS lesions, C2 and C4-5.  Minor disc disease. 12/11/2009 MRI T-SPINE:  Nonenhancing MS lesion, T2-3. 08/22/2014 MRI C-SPINE:  Nonenhancing plaque in the dorsal column at C4 and C4-5, unchanged from prior study of 04/21/2012.  The previously described C2 lesion back in 2011 is not seen.  There is a sbutle lesion noted at T2-3. 08/22/2014 MRI T-SPINE:  Limited quality; T2-3 lesion is better seen on C-spine imaging in 2013 and 2016 and appears unchanged. 04/21/2017 MRI BRAIN:  Unchanged from prior study of 08/22/2014  04/21/2017 MRI C-SPINE:  Unchanged from prior study of 08/22/2014 03/30/2019 MRI BRAIN:  Unchanged from 04/21/2017. 03/30/2019 MRI C-SPINE:  Unchanged from 04/21/2017, with a stable right dorsal hyperintesity at C4-5 typical of MS. 01/06/2021 MRI BRAIN W WO:  moderately advanced  chronic demyelinating disease limited to the cerebral white matter with no active demyelination.   01/06/2021 MRI C-SPINE W WO:  subtle evidence of chronic demyelinating disease in the cervical spinal cord at C4-5 level.   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  Severe weaknesses in visual spatial and verbal function noted.  Moderate impairment in executive function, attention, problem solving, working memory, and global cognitive score.  Pure memory was only slightly diminished according to [employer].   No family history of MS.  Mother had Alzheimer's disease.  PAST MEDICAL HISTORY: Past Medical History:  Diagnosis Date   Anxiety    Asthma    Hypertension    Multiple sclerosis (HCC)     MEDICATIONS: Current Outpatient Medications on File Prior to Visit  Medication Sig Dispense Refill   albuterol (VENTOLIN HFA) 108 (90 Base) MCG/ACT inhaler Inhale into the lungs every 6 (six) hours as needed for wheezing or shortness of breath.     amitriptyline (ELAVIL) 10 MG tablet TAKE 1 TABLET AT BEDTIME 90 tablet 0   bisoprolol-hydrochlorothiazide (ZIAC) 5-6.25 MG tablet 1 tablet     cyanocobalamin 2000 MCG tablet      diazepam (VALIUM) 5 MG tablet Take 1 tablet 30-40 minutes prior to MRI 2 tablet 0   DULoxetine (CYMBALTA) 60 MG capsule TAKE 1 CAPSULE EVERY DAY 90 capsule 0   estradiol (ESTRACE) 0.1 MG/GM vaginal cream Place 1 Applicatorful vaginally at bedtime.     Ocrelizumab (OCREVUS IV) Inject into the vein.     OLANZapine (ZYPREXA) 5 MG tablet Take 1 tablet (5 mg total) by mouth at bedtime. 30 tablet 5   No current facility-administered medications on file prior to visit.    ALLERGIES: Allergies  Allergen Reactions   Chocolate    Grass Pollen(K-O-R-T-Swt Vern) Other (See Comments)   Soybean-Containing Drug Products Other (See Comments)    FAMILY HISTORY: Family History  Problem Relation Age of Onset    Dementia Mother    Hypertension Mother    Alzheimer's disease Mother    Hypertension Father    Osteoporosis Father    Hepatitis C Brother       Objective:  *** General: No acute distress.  Patient appears well-groomed.   Head:  Normocephalic/atraumatic Eyes:  Fundi examined but not visualized Neck: supple, no paraspinal tenderness, full range of motion Heart:  Regular rate and rhythm Neurological Exam: alert and oriented to person, place, and time.  Speech fluent and not dysarthric, language intact.  CN II-XII intact. Bulk and tone normal, muscle strength 5/5 throughout.  Sensation to light touch intact.  Deep tendon reflexes 2+ throughout, toes downgoing.  Finger to nose testing intact.  Gait unsteady, Romberg negative.   Shon Millet, DO  CC: Effie Shy, MD

## 2021-12-26 ENCOUNTER — Encounter: Payer: Self-pay | Admitting: Neurology

## 2021-12-26 ENCOUNTER — Ambulatory Visit: Payer: Medicare PPO | Admitting: Neurology

## 2021-12-26 VITALS — BP 137/86 | HR 61 | Ht 62.0 in | Wt 147.0 lb

## 2021-12-26 DIAGNOSIS — G35 Multiple sclerosis: Secondary | ICD-10-CM | POA: Diagnosis not present

## 2021-12-26 NOTE — Patient Instructions (Signed)
Continue Ocrevus every 6 months Continue:  duloxetine, amitriptyline, olanzapine, D3 5000 IU daily, B12 Repeat CBC with diff, hepatic panel, quantitative immunoglobulin panel and vit D level in 6 months Follow up in 6 months (after repeat labs)

## 2022-01-11 ENCOUNTER — Other Ambulatory Visit (HOSPITAL_BASED_OUTPATIENT_CLINIC_OR_DEPARTMENT_OTHER): Payer: Self-pay | Admitting: Internal Medicine

## 2022-01-11 DIAGNOSIS — Z1231 Encounter for screening mammogram for malignant neoplasm of breast: Secondary | ICD-10-CM

## 2022-01-25 ENCOUNTER — Ambulatory Visit (HOSPITAL_BASED_OUTPATIENT_CLINIC_OR_DEPARTMENT_OTHER)
Admission: RE | Admit: 2022-01-25 | Discharge: 2022-01-25 | Disposition: A | Discharge status: Home / Self Care | Payer: Medicare PPO | Source: Ambulatory Visit | Attending: Internal Medicine | Admitting: Internal Medicine

## 2022-01-25 DIAGNOSIS — Z1231 Encounter for screening mammogram for malignant neoplasm of breast: Secondary | ICD-10-CM

## 2022-03-13 ENCOUNTER — Other Ambulatory Visit: Payer: Self-pay | Admitting: Neurology

## 2022-05-01 ENCOUNTER — Ambulatory Visit: Payer: Medicare PPO

## 2022-05-02 ENCOUNTER — Telehealth: Payer: Self-pay | Admitting: Anesthesiology

## 2022-05-02 NOTE — Telephone Encounter (Signed)
Patient called stating that she was not able to get her Ocrevus infusion because she is sick. She would like to get rescheduled for her infusion. Requests a call back.

## 2022-05-02 NOTE — Telephone Encounter (Signed)
Per Patient she reached out to the [facility] on [address] and was able to reschedule her appt.

## 2022-05-15 ENCOUNTER — Ambulatory Visit (INDEPENDENT_AMBULATORY_CARE_PROVIDER_SITE_OTHER): Payer: Medicare PPO

## 2022-05-15 VITALS — BP 114/65 | HR 84 | Temp 97.5°F | Resp 20 | Ht 62.0 in | Wt 148.2 lb

## 2022-05-15 DIAGNOSIS — G35 Multiple sclerosis: Secondary | ICD-10-CM

## 2022-05-15 MED ORDER — DIPHENHYDRAMINE HCL 25 MG PO CAPS
50.0000 mg | ORAL_CAPSULE | Freq: Once | ORAL | Status: AC
  Administered 2022-05-15: 50 mg via ORAL

## 2022-05-15 MED ORDER — SODIUM CHLORIDE 0.9 % IV SOLN
600.0000 mg | Freq: Once | INTRAVENOUS | Status: AC
  Administered 2022-05-15: 600 mg via INTRAVENOUS
  Filled 2022-05-15: qty 20

## 2022-05-15 MED ORDER — METHYLPREDNISOLONE SODIUM SUCC 125 MG IJ SOLR
125.0000 mg | Freq: Once | INTRAMUSCULAR | Status: AC
  Administered 2022-05-15: 125 mg via INTRAVENOUS

## 2022-05-15 MED ORDER — ACETAMINOPHEN 325 MG PO TABS
650.0000 mg | ORAL_TABLET | Freq: Once | ORAL | Status: AC
  Administered 2022-05-15: 650 mg via ORAL

## 2022-05-15 NOTE — Progress Notes (Signed)
Diagnosis: Multiple Sclerosis  Provider:  Chilton Greathouse MD  Procedure: Infusion  IV Type: Peripheral, IV Location: R Hand  Ocrevus (Ocrelizumab), Dose: 600 mg  Infusion Start Time: 0935  Infusion Stop Time: 1331  Post Infusion IV Care: Observation period completed and Peripheral IV Discontinued  Discharge: Condition: Good, Destination: Home . AVS provided to patient.   Performed by:  Nat Math, RN

## 2022-06-24 ENCOUNTER — Other Ambulatory Visit (INDEPENDENT_AMBULATORY_CARE_PROVIDER_SITE_OTHER): Payer: Medicare PPO

## 2022-06-24 DIAGNOSIS — G35 Multiple sclerosis: Secondary | ICD-10-CM

## 2022-06-24 LAB — CBC WITH DIFFERENTIAL/PLATELET
Basophils Absolute: 0 10*3/uL (ref 0.0–0.1)
Basophils Relative: 0.8 % (ref 0.0–3.0)
Eosinophils Absolute: 0.3 10*3/uL (ref 0.0–0.7)
Eosinophils Relative: 4.9 % (ref 0.0–5.0)
HCT: 40.9 % (ref 36.0–46.0)
Hemoglobin: 13.4 g/dL (ref 12.0–15.0)
Lymphocytes Relative: 17 % (ref 12.0–46.0)
Lymphs Abs: 1 10*3/uL (ref 0.7–4.0)
MCHC: 32.9 g/dL (ref 30.0–36.0)
MCV: 93.6 fl (ref 78.0–100.0)
Monocytes Absolute: 0.7 10*3/uL (ref 0.1–1.0)
Monocytes Relative: 12.2 % — ABNORMAL HIGH (ref 3.0–12.0)
Neutro Abs: 3.8 10*3/uL (ref 1.4–7.7)
Neutrophils Relative %: 65.1 % (ref 43.0–77.0)
Platelets: 332 10*3/uL (ref 150.0–400.0)
RBC: 4.36 Mil/uL (ref 3.87–5.11)
RDW: 13.8 % (ref 11.5–15.5)
WBC: 5.9 10*3/uL (ref 4.0–10.5)

## 2022-06-24 LAB — HEPATIC FUNCTION PANEL
ALT: 14 U/L (ref 0–35)
AST: 24 U/L (ref 0–37)
Albumin: 4.4 g/dL (ref 3.5–5.2)
Alkaline Phosphatase: 68 U/L (ref 39–117)
Bilirubin, Direct: 0.1 mg/dL (ref 0.0–0.3)
Total Bilirubin: 0.5 mg/dL (ref 0.2–1.2)
Total Protein: 7.8 g/dL (ref 6.0–8.3)

## 2022-06-24 LAB — VITAMIN D 25 HYDROXY (VIT D DEFICIENCY, FRACTURES): VITD: 66.3 ng/mL (ref 30.00–100.00)

## 2022-06-25 LAB — IGG, IGA, IGM
IgG (Immunoglobin G), Serum: 1423 mg/dL (ref 600–1540)
IgM, Serum: 35 mg/dL — ABNORMAL LOW (ref 50–300)
Immunoglobulin A: 229 mg/dL (ref 70–320)

## 2022-06-27 NOTE — Progress Notes (Signed)
NEUROLOGY FOLLOW UP OFFICE NOTE  Joyce Hayes [id number]  Assessment/Plan:   1  Multiple sclerosis 2  Headache 3  Depression/anxiety   DMT:  Ocrevus infusion continue duloxetine, amitriptyline and olanzapine.  Check quantitative immunoglobulin panel, CBC with diff, LFTs and vit D level in 6 months (prior to follow up) D3 5000 IU daily Follow up 6 months.     Subjective:  Joyce Hayes is a 67 year old -harightnded female with MS, HTN, asthma and anxiety who follows up for multiple sclerosis.   UPDATE: Current DMT:  Ocrevus (started 09/17/2017) Current medications:  Cymbalta 60mg  QD (pain and depression), amitriptyline 10mg  QHS (sleep, headache and depression), olanzapine 5mg  QHS (sleep, paranoid ideation), diazepam 5mg  BID PRN, D3 5000 IU QD, B12 204mcg QD, CoQ-10   06/24/2022 LABS:  CBC with WBC 5.9, HGB 13.4, HCT 40.9, PLT 332, ALC 1; hepatic panel t bili 0.5, ALP 68, AST 24, ALT 14; IgA 229, IgG 1423, IgM 35; vit D 66.30     Vision:  Residual blurred vision in right eye from optic neuritis (approx 2004).  Feels more pressure in the right eye.   Motor:  No issues but sometimes grasping with her hands feels a little weak with overuse.  Sometimes left arm twitches.   Sensory: Right big toe feels tingling again.  Comes and goes.   Pain:  Chronic neck pain.  Chronic l low back pain, right leg radiculopathy Headaches:  a pounding pain across the forehead.  No nausea, vomiting, photophobia, phonophobia or visual disturbance.  Typically last 30 minutes and occur about once or twice a month.  Treats with Advil.  Often associated with elevated blood pressure. Gait:  Sometimes feels a little off balance. Bowel/Bladder:  Some urinary incontinence.  History of constipation currently well-managed. Fatigue:  Sometimes feels tired but nothing significant. Cognition:  Mild cognitive disorder. Mood:  Anxiety, depression - comes and goes.  Separated from her husband.  History of  paranoid delusions (previously believed that her husband was planning to kill her and that FBI was working with her).   HISTORY: Diagnosed with MS in 2000 presenting with right foot numbness.  Diagnosed via MRI.  Did not have an LP.  Treated with steroids.   Past DMT:  Avonex (2000-2001, stopped due to flu-like symptoms), Rebif (2001-early 2019 (injection site reaction, depression, lost efficacy) Other past medications:  Wellbutrin   Covid-19 Vaccine:  Moderna - second dose 10/14/2019   Imaging: 11/03/2007 MRI BRAIN:  Reportedly stable compared to prior study from 2007. 06/15/2009 MRI BRAIN:  There are several nonenhancing periventricular white matter and juxtacortical white matter hyperintensities typical of MS.  There is mild atrophy and ventricular enlargement.  Reportedly showed possible minimal progression compared with prior study from 06/20/2005. 12/11/2009 MRI C-SPINE:  Nonenhancing MS lesions, C2 and C4-5.  Minor disc disease. 12/11/2009 MRI T-SPINE:  Nonenhancing MS lesion, T2-3. 08/22/2014 MRI C-SPINE:  Nonenhancing plaque in the dorsal column at C4 and C4-5, unchanged from prior study of 04/21/2012.  The previously described C2 lesion back in 2011 is not seen.  There is a sbutle lesion noted at T2-3. 08/22/2014 MRI T-SPINE:  Limited quality; T2-3 lesion is better seen on C-spine imaging in 2013 and 2016 and appears unchanged. 04/21/2017 MRI BRAIN:  Unchanged from prior study of 08/22/2014 04/21/2017 MRI C-SPINE:  Unchanged from prior study of 08/22/2014 03/30/2019 MRI BRAIN:  Unchanged from 04/21/2017. 03/30/2019 MRI C-SPINE:  Unchanged from 04/21/2017, with a stable right dorsal hyperintesity at C4-5 typical  of MS. 01/06/2021 MRI BRAIN W WO:  moderately advanced chronic demyelinating disease limited to the cerebral white matter with no active demyelination.   01/06/2021 MRI C-SPINE W WO:  subtle evidence of chronic demyelinating disease in the cervical spinal cord at C4-5 level. 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  Severe weaknesses in visual spatial and verbal function noted.  Moderate impairment in executive function, attention, problem solving, working memory, and global cognitive score.  Pure memory was only slightly diminished according to [employer].   No family history of MS.  Mother had Alzheimer's disease.  PAST MEDICAL HISTORY: Past Medical History:  Diagnosis Date   Anxiety    Asthma    Hypertension    Multiple sclerosis ([place])     MEDICATIONS: Current Outpatient Medications on File Prior to Visit  Medication Sig Dispense Refill   albuterol (VENTOLIN HFA) 108 (90 Base) MCG/ACT inhaler Inhale into the lungs every 6 (six) hours as needed for wheezing or shortness of breath.     amitriptyline (ELAVIL) 10 MG tablet TAKE 1 TABLET AT BEDTIME 90 tablet 0   bisoprolol-hydrochlorothiazide (ZIAC) 5-6.25 MG tablet 1 tablet     cholecalciferol (VITAMIN D3) 25 MCG (1000 UNIT) tablet 1 T QD     cyanocobalamin 2000 MCG tablet      diazepam (VALIUM) 5 MG tablet Take 1 tablet 30-40 minutes prior to MRI (Patient not taking: Reported on 12/26/2021) 2 tablet 0   DULoxetine (CYMBALTA) 60 MG capsule TAKE 1 CAPSULE EVERY DAY 90 capsule 0   estradiol (ESTRACE) 0.1 MG/GM vaginal cream Place 1 Applicatorful vaginally at bedtime.     Multiple Vitamins-Minerals (HAIR/SKIN/NAILS/BIOTIN PO) Take 500 mg by mouth. One time daily     Ocrelizumab (OCREVUS IV) Inject into the vein.     OLANZapine (ZYPREXA) 5 MG tablet Take 1 tablet (5 mg total) by mouth at bedtime. 30 tablet 5   saccharomyces boulardii (FLORASTOR) 250 MG capsule Take 250 mg by mouth daily. One daily     No current facility-administered medications on file prior to visit.    ALLERGIES: Allergies  Allergen Reactions   Chocolate     Grass Pollen(K-O-R-T-Swt Vern) Other (See Comments)   Soybean-Containing Drug Products Other (See Comments)    FAMILY HISTORY: Family History  Problem Relation Age of Onset   Dementia Mother    Hypertension Mother    Alzheimer's disease Mother    Hypertension Father    Osteoporosis Father    Hepatitis C Brother       Objective:  Blood pressure 123/79, pulse 85, height 5\' 2"  (1.575 m), weight 146 lb 6.4 oz (66.4 kg), SpO2 93 %. General: No acute distress.  Patient appears well-groomed.   Head:  Normocephalic/atraumatic Eyes:  Fundi examined but not visualized Neck: supple, no paraspinal tenderness, full range of motion Heart:  Regular rate and rhythm Neurological Exam: alert and oriented to person, place, and time.  Speech fluent and not dysarthric, language intact.  CN II-XII intact. Bulk and tone normal, muscle strength 5/5 throughout.  Sensation to light touch intact.  Deep tendon reflexes 2+ throughout.  Finger to nose testing intact.  Gait normal, Romberg negative.   Metta Clines, DO  CC: Leota Sauers, MD

## 2022-07-01 ENCOUNTER — Encounter: Payer: Self-pay | Admitting: Neurology

## 2022-07-01 ENCOUNTER — Ambulatory Visit: Payer: Medicare PPO | Admitting: Neurology

## 2022-07-01 VITALS — BP 123/79 | HR 85 | Ht 62.0 in | Wt 146.4 lb

## 2022-07-01 DIAGNOSIS — G35 Multiple sclerosis: Secondary | ICD-10-CM | POA: Diagnosis not present

## 2022-07-01 NOTE — Patient Instructions (Signed)
Continue Ocrevus, D3 5000 IU daily, duloxetine, amitriptyline and olanzapine Check CBC with diff, LFTs and vit D level in 6 months Follow up in 6 months.

## 2022-08-07 ENCOUNTER — Other Ambulatory Visit: Payer: Self-pay | Admitting: Neurology

## 2022-11-13 ENCOUNTER — Ambulatory Visit (INDEPENDENT_AMBULATORY_CARE_PROVIDER_SITE_OTHER): Payer: Medicare PPO

## 2022-11-13 VITALS — BP 125/76 | HR 87 | Temp 98.1°F | Resp 16 | Ht 62.0 in | Wt 135.4 lb

## 2022-11-13 DIAGNOSIS — G35 Multiple sclerosis: Secondary | ICD-10-CM | POA: Diagnosis not present

## 2022-11-13 MED ORDER — SODIUM CHLORIDE 0.9 % IV SOLN
600.0000 mg | Freq: Once | INTRAVENOUS | Status: AC
  Administered 2022-11-13: 600 mg via INTRAVENOUS
  Filled 2022-11-13: qty 20

## 2022-11-13 MED ORDER — METHYLPREDNISOLONE SODIUM SUCC 125 MG IJ SOLR
125.0000 mg | Freq: Once | INTRAMUSCULAR | Status: AC
  Administered 2022-11-13: 125 mg via INTRAVENOUS
  Filled 2022-11-13: qty 2

## 2022-11-13 MED ORDER — ACETAMINOPHEN 325 MG PO TABS
650.0000 mg | ORAL_TABLET | Freq: Once | ORAL | Status: AC
  Administered 2022-11-13: 650 mg via ORAL
  Filled 2022-11-13: qty 2

## 2022-11-13 MED ORDER — DIPHENHYDRAMINE HCL 25 MG PO CAPS
50.0000 mg | ORAL_CAPSULE | Freq: Once | ORAL | Status: AC
  Administered 2022-11-13: 50 mg via ORAL
  Filled 2022-11-13: qty 2

## 2022-11-13 NOTE — Progress Notes (Signed)
Diagnosis: Multiple Sclerosis  Provider:  Chilton Greathouse MD  Procedure: IV Infusion  IV Type: Peripheral, IV Location: R Antecubital  Ocrevus (Ocrelizumab), Dose: 600 mg  Infusion Start Time: 0935  Infusion Stop Time: 1338  Post Infusion IV Care: Observation period completed  Discharge: Condition: Good, Destination: Home . AVS Provided  Performed by:  Nat Math, RN

## 2022-12-23 ENCOUNTER — Other Ambulatory Visit (INDEPENDENT_AMBULATORY_CARE_PROVIDER_SITE_OTHER): Payer: Medicare PPO

## 2022-12-23 ENCOUNTER — Other Ambulatory Visit: Payer: Self-pay

## 2022-12-23 DIAGNOSIS — G35 Multiple sclerosis: Secondary | ICD-10-CM

## 2022-12-23 LAB — HEPATIC FUNCTION PANEL
ALT: 8 U/L (ref 0–35)
AST: 20 U/L (ref 0–37)
Albumin: 4.3 g/dL (ref 3.5–5.2)
Alkaline Phosphatase: 60 U/L (ref 39–117)
Bilirubin, Direct: 0.1 mg/dL (ref 0.0–0.3)
Total Bilirubin: 0.8 mg/dL (ref 0.2–1.2)
Total Protein: 7.2 g/dL (ref 6.0–8.3)

## 2022-12-23 LAB — VITAMIN D 25 HYDROXY (VIT D DEFICIENCY, FRACTURES): VITD: 90.51 ng/mL (ref 30.00–100.00)

## 2022-12-24 LAB — CBC WITH DIFFERENTIAL
Basophils Absolute: 0.1 10*3/uL (ref 0.0–0.2)
Basos: 1 %
EOS (ABSOLUTE): 0.3 10*3/uL (ref 0.0–0.4)
Eos: 5 %
Hematocrit: 40.6 % (ref 34.0–46.6)
Hemoglobin: 13.4 g/dL (ref 11.1–15.9)
Immature Grans (Abs): 0 10*3/uL (ref 0.0–0.1)
Immature Granulocytes: 0 %
Lymphocytes Absolute: 1.1 10*3/uL (ref 0.7–3.1)
Lymphs: 18 %
MCH: 31.1 pg (ref 26.6–33.0)
MCHC: 33 g/dL (ref 31.5–35.7)
MCV: 94 fL (ref 79–97)
Monocytes Absolute: 0.6 10*3/uL (ref 0.1–0.9)
Monocytes: 10 %
Neutrophils Absolute: 4 10*3/uL (ref 1.4–7.0)
Neutrophils: 66 %
RBC: 4.31 x10E6/uL (ref 3.77–5.28)
RDW: 12.3 % (ref 11.7–15.4)
WBC: 6 10*3/uL (ref 3.4–10.8)

## 2022-12-24 LAB — IGG, IGA, IGM
IgG (Immunoglobin G), Serum: 1362 mg/dL (ref 600–1540)
IgM, Serum: 30 mg/dL — ABNORMAL LOW (ref 50–300)
Immunoglobulin A: 220 mg/dL (ref 70–320)

## 2022-12-30 NOTE — Progress Notes (Unsigned)
NEUROLOGY FOLLOW UP OFFICE NOTE  Joyce Hayes [id number]  Assessment/Plan:   1  Multiple sclerosis 2  Headache 3  Depression/anxiety   DMT:  Ocrevus infusion continue duloxetine, amitriptyline and olanzapine.  Check quantitative immunoglobulin panel, CBC with diff, LFTs and vit D level in 6 months (prior to follow up) D3 5000 IU daily Follow up ***     Subjective:  Joyce Hayes is a 67 year old -harightnded female with MS, HTN, asthma and anxiety who follows up for multiple sclerosis.   UPDATE: Current DMT:  Ocrevus (started 09/17/2017) Current medications:  Cymbalta 60mg  QD (pain and depression), amitriptyline 10mg  QHS (sleep, headache and depression), olanzapine 5mg  QHS (sleep, paranoid ideation), diazepam 5mg  BID PRN, D3 5000 IU QD, B12 QD, CoQ-10   12/23/2022 LABS:  CBC with WBC 6, HGB 13.4, HCT 40.6, PLT 332, ALC 1.1; hepatic panel with t bili 0.8, ALP 60, AST 20, ALT 8; IgA 220, IgG 1362, IgM 30; vit D 90     Vision:  Residual blurred vision in right eye from optic neuritis (approx 2004).  Feels more pressure in the right eye.   Motor:  No issues but sometimes grasping with her hands feels a little weak with overuse.  Sometimes left arm twitches.   Sensory: Right big toe feels tingling again.  Comes and goes.   Pain:  Chronic neck pain.  Chronic l low back pain, right leg radiculopathy Headaches:  a pounding pain across the forehead.  No nausea, vomiting, photophobia, phonophobia or visual disturbance.  Typically last 30 minutes and occur about once or twice a month.  Treats with Advil.  Often associated with elevated blood pressure. Gait:  Sometimes feels a little off balance. Bowel/Bladder:  Some urinary incontinence.  History of constipation currently well-managed. Fatigue:  Sometimes feels tired but nothing significant. Cognition:  Mild cognitive disorder. Mood:  Anxiety, depression - comes and goes.  Separated from her husband.  History of paranoid  delusions (previously believed that her husband was planning to kill her and that FBI was working with her).   HISTORY: Diagnosed with MS in 2000 presenting with right foot numbness.  Diagnosed via MRI.  Did not have an LP.  Treated with steroids.   Past DMT:  Avonex (2000-2001, stopped due to flu-like symptoms), Rebif (2001-early 2019 (injection site reaction, depression, lost efficacy) Other past medications:  Wellbutrin   Covid-19 Vaccine:  Moderna - second dose 10/14/2019   Imaging: 11/03/2007 MRI BRAIN:  Reportedly stable compared to prior study from 2007. 06/15/2009 MRI BRAIN:  There are several nonenhancing periventricular white matter and juxtacortical white matter hyperintensities typical of MS.  There is mild atrophy and ventricular enlargement.  Reportedly showed possible minimal progression compared with prior study from 06/20/2005. 12/11/2009 MRI C-SPINE:  Nonenhancing MS lesions, C2 and C4-5.  Minor disc disease. 12/11/2009 MRI T-SPINE:  Nonenhancing MS lesion, T2-3. 08/22/2014 MRI C-SPINE:  Nonenhancing plaque in the dorsal column at C4 and C4-5, unchanged from prior study of 04/21/2012.  The previously described C2 lesion back in 2011 is not seen.  There is a sbutle lesion noted at T2-3. 08/22/2014 MRI T-SPINE:  Limited quality; T2-3 lesion is better seen on C-spine imaging in 2013 and 2016 and appears unchanged. 04/21/2017 MRI BRAIN:  Unchanged from prior study of 08/22/2014 04/21/2017 MRI C-SPINE:  Unchanged from prior study of 08/22/2014 03/30/2019 MRI BRAIN:  Unchanged from 04/21/2017. 03/30/2019 MRI C-SPINE:  Unchanged from 04/21/2017, with a stable right dorsal hyperintesity at C4-5 typical  of MS. 01/06/2021 MRI BRAIN W WO:  moderately advanced chronic demyelinating disease limited to the cerebral white matter with no active demyelination.   01/06/2021 MRI C-SPINE W WO:  subtle evidence of chronic demyelinating disease in the cervical spinal cord at C4-5 level. 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  Severe weaknesses in visual spatial and verbal function noted.  Moderate impairment in executive function, attention, problem solving, working memory, and global cognitive score.  Pure memory was only slightly diminished according to [employer].   No family history of MS.  Mother had Alzheimer's disease.  PAST MEDICAL HISTORY: Past Medical History:  Diagnosis Date   Anxiety    Asthma    Hypertension    Multiple sclerosis (HCC)     MEDICATIONS: Current Outpatient Medications on File Prior to Visit  Medication Sig Dispense Refill   albuterol (VENTOLIN HFA) 108 (90 Base) MCG/ACT inhaler Inhale into the lungs every 6 (six) hours as needed for wheezing or shortness of breath.     amitriptyline (ELAVIL) 10 MG tablet TAKE 1 TABLET AT BEDTIME 90 tablet 3   amLODipine (NORVASC) 5 MG tablet Take 5 mg by mouth daily.     atorvastatin (LIPITOR) 10 MG tablet Take 1 tablet by mouth at bedtime.     bisoprolol-hydrochlorothiazide (ZIAC) 5-6.25 MG tablet 1 tablet     cholecalciferol (VITAMIN D3) 25 MCG (1000 UNIT) tablet 1 T QD     cyanocobalamin 2000 MCG tablet      DULoxetine (CYMBALTA) 60 MG capsule TAKE 1 CAPSULE EVERY DAY 90 capsule 3   estradiol (ESTRACE) 0.1 MG/GM vaginal cream Place 1 Applicatorful vaginally at bedtime.     Multiple Vitamins-Minerals (HAIR/SKIN/NAILS/BIOTIN PO) Take 500 mg by mouth. One time daily     Ocrelizumab (OCREVUS IV) Inject into the vein.     OLANZapine (ZYPREXA) 5 MG tablet Take 1 tablet (5 mg total) by mouth at bedtime. 30 tablet 5   saccharomyces boulardii (FLORASTOR) 250 MG capsule Take 250 mg by mouth daily. One daily     No current facility-administered medications on file prior to visit.    ALLERGIES: Allergies  Allergen Reactions    Chocolate    Grass Pollen(K-O-R-T-Swt Vern) Other (See Comments)   Soybean-Containing Drug Products Other (See Comments)    FAMILY HISTORY: Family History  Problem Relation Age of Onset   Dementia Mother    Hypertension Mother    Alzheimer's disease Mother    Hypertension Father    Osteoporosis Father    Hepatitis C Brother       Objective:  *** General: No acute distress.  Patient appears well-groomed.   Head:  Normocephalic/atraumatic Eyes:  Fundi examined but not visualized Neck: supple, no paraspinal tenderness, full range of motion Heart:  Regular rate and rhythm Neurological Exam: ***   Shon Millet, DO  CC: Effie Shy, MD

## 2022-12-31 ENCOUNTER — Encounter: Payer: Self-pay | Admitting: Neurology

## 2022-12-31 ENCOUNTER — Telehealth: Payer: Self-pay

## 2022-12-31 ENCOUNTER — Ambulatory Visit: Payer: Medicare PPO | Admitting: Neurology

## 2022-12-31 VITALS — BP 126/72 | HR 80 | Ht 62.0 in | Wt 133.0 lb

## 2022-12-31 DIAGNOSIS — F419 Anxiety disorder, unspecified: Secondary | ICD-10-CM

## 2022-12-31 DIAGNOSIS — G35 Multiple sclerosis: Secondary | ICD-10-CM | POA: Diagnosis not present

## 2022-12-31 DIAGNOSIS — F32A Depression, unspecified: Secondary | ICD-10-CM

## 2022-12-31 DIAGNOSIS — R519 Headache, unspecified: Secondary | ICD-10-CM | POA: Diagnosis not present

## 2022-12-31 NOTE — Patient Instructions (Addendum)
Plan to change from Ocrevus to teriflunomide (Aubagio) 14mg  daily. Check MRI of brain and cervical spine with and without contrast Check liver function test once a month for 6 months after start. Continue duloxetine, amitriptyline and olanzapine Follow up 6 months.    Teriflunomide Tablets What is this medication? TERIFLUNOMIDE (TER i FLOO noe mide) treats multiple sclerosis (MS). It works by slowing down an overactive immune system, which prevents or delays worsening symptoms. It also decreases the number of flare-ups. This medicine may be used for other purposes; ask your health care provider or pharmacist if you have questions. COMMON BRAND NAME(S): AUBAGIO What should I tell my care team before I take this medication? They need to know if you have any of these conditions: Diabetes Have a fever or infection High blood pressure Immune system problems Kidney disease Liver disease Low blood cell counts, such as low white cells, platelets, red cell counts Lung or breathing disease, such as asthma Recently received or scheduled to receive a vaccine Receiving treatment for cancer Skin conditions or sensitivity Tingling of the fingers or toes, or other nerve disorder Tuberculosis An unusual or allergic reaction to teriflunomide, other medications, food, dyes, or preservatives If you or your partner are pregnant or trying to get pregnant Breastfeeding How should I use this medication? Take this medication by mouth with a glass of water. Take it as directed on the prescription label. You can take it with or without food. If it upsets your stomach, take it with food. Keep taking it unless your care team tells you to stop. A special MedGuide will be given to you by the pharmacist with each prescription and refill. Be sure to read this information carefully each time. Talk to your care team about the use of this medication in children. Special care may be needed. Overdosage: If you think  you have taken too much of this medicine contact a poison control center or emergency room at once. NOTE: This medicine is only for you. Do not share this medicine with others. What if I miss a dose? If you miss a dose, take it as soon as you can. If it is almost time for your next dose, take only that dose. Do not take double or extra doses. What may interact with this medication? Do not take this medication with any of the following: Leflunomide This medication may also interact with the following: Alosetron Caffeine Cefaclor Certain medications for diabetes, such as nateglinide, repaglinide, rosiglitazone, pioglitazone Certain medications for high cholesterol, such as atorvastatin, pravastatin, rosuvastatin, simvastatin Charcoal Cholestyramine Ciprofloxacin Duloxetine Estrogen and progestin hormones Furosemide Ketoprofen Live virus vaccines Medications that increase your risk for infection Methotrexate Mitoxantrone Paclitaxel Penicillin Theophylline Tizanidine Warfarin This list may not describe all possible interactions. Give your health care provider a list of all the medicines, herbs, non-prescription drugs, or dietary supplements you use. Also tell them if you smoke, drink alcohol, or use illegal drugs. Some items may interact with your medicine. What should I watch for while using this medication? Visit your care team for regular checks on your progress. Tell your care team if your symptoms do not start to get better or if they get worse. You may need blood work while taking this medication. This medication may cause serious skin reactions. They can happen weeks to months after starting the medication. Contact your care team right away if you notice fevers or flu-like symptoms with a rash. The rash may be red or purple and then turn into  blisters or peeling of the skin. You may also notice a red rash with swelling of the face, lips, or lymph nodes in your neck or under your  arms. This medication may stay in your body for up to 2 years after your last dose. Tell your care team about any unusual side effects or symptoms. A medication can be given to help lower your blood levels of this medication more quickly. You should not receive some vaccines during your treatment and for 6 months after the last dose. Talk to your care team if you or your partner may be pregnant. Serious birth defects can occur if you take this medication during pregnancy and for some time after the last dose. You will need a negative pregnancy test before starting this medication. Contraception is recommended while taking this medication and for some time after the last dose. Your care team can help you find the option that works for you. If your partner can get pregnant, use a condom during sex while taking this medication and for some time after the last dose. Do not breastfeed while taking this medication. What side effects may I notice from receiving this medication? Side effects that you should report to your care team as soon as possible: Allergic reactions or angioedema--skin rash, itching or hives, swelling of the face, eyes, lips, tongue, arms, or legs, trouble swallowing or breathing Dry cough, shortness of breath or trouble breathing Increase in blood pressure Infection--fever, chills, cough, sore throat, wounds that don't heal, pain or trouble when passing urine, general feeling of discomfort or being unwell Liver injury--right upper belly pain, loss of appetite, nausea, light-colored stool, dark yellow or brown urine, yellowing skin or eyes, unusual weakness or fatigue Pain, tingling, or numbness in the hands or feet Rash, fever, and swollen lymph nodes Redness, blistering, peeling, or loosening of the skin, including inside the mouth Unusual bruising or bleeding Side effects that usually do not require medical attention (report to your care team if they continue or are  bothersome): Diarrhea Hair loss Headache Joint pain Nausea This list may not describe all possible side effects. Call your doctor for medical advice about side effects. You may report side effects to FDA at 1-800-FDA-1088. Where should I keep my medication? Keep out of the reach of children and pets. Store at room temperature between 20 and 25 degrees C (68 and 77 degrees F). Get rid of any unused medication after the expiration date. To get rid of medications that are no longer needed or have expired: Take the medication to a medication take-back program. Check with your pharmacy or law enforcement to find a location. If you cannot return the medication, ask your pharmacist or care team how to get rid of this medication safely. NOTE: This sheet is a summary. It may not cover all possible information. If you have questions about this medicine, talk to your doctor, pharmacist, or health care provider.  2024 Elsevier/Gold Standard (2021-10-26 00:00:00)

## 2022-12-31 NOTE — Telephone Encounter (Signed)
Patient seen in office today. Plan to stop Ocrevus and start Aubagio.   PA team please start PA for Aubagio 14 mg.

## 2023-01-01 ENCOUNTER — Other Ambulatory Visit (HOSPITAL_COMMUNITY): Payer: Self-pay

## 2023-01-01 ENCOUNTER — Telehealth: Payer: Self-pay

## 2023-01-01 NOTE — Telephone Encounter (Signed)
*  [facility]  PA request received for Aubagio 14MG  tablets  PA submitted to [facility] via CMM and has been APPROVED from 01/01/2023-06/17/2023  Key: [id number]

## 2023-01-01 NOTE — Telephone Encounter (Signed)
PA has been submitted and will be updated in additional encounter.  

## 2023-01-17 ENCOUNTER — Telehealth: Payer: Self-pay | Admitting: Neurology

## 2023-01-17 ENCOUNTER — Other Ambulatory Visit: Payer: Self-pay | Admitting: Neurology

## 2023-01-17 MED ORDER — DIAZEPAM 5 MG PO TABS: ORAL_TABLET | ORAL | 0 refills | Status: DC

## 2023-01-17 NOTE — Telephone Encounter (Signed)
Pt called informed that her RX for her valium was sent in for MRI to [facility] on [address] in [place]

## 2023-01-17 NOTE — Telephone Encounter (Signed)
Pt is calling in stating that she has scheduled her MRI will need a prescription of Valium due to the fact she is very claustiphicl.  Pt stated that it can be called in to [facility] on [address] in [place]

## 2023-01-20 MED ORDER — DIAZEPAM 5 MG PO TABS: ORAL_TABLET | ORAL | 0 refills | Status: DC

## 2023-01-20 NOTE — Telephone Encounter (Signed)
Pt is calling in stating that she needs a second valium due to having two procedure done one with and w/out contrast.  Pharm: [facility] on [address] in [place], [place]

## 2023-01-20 NOTE — Telephone Encounter (Signed)
Rx sent for second dose if needed. Thanks

## 2023-01-20 NOTE — Addendum Note (Signed)
Addended by: Van Clines on: 01/20/2023 12:26 PM   Modules accepted: Orders

## 2023-01-21 NOTE — Telephone Encounter (Signed)
LMOVM of second dose sent to the pharmacy.

## 2023-02-12 ENCOUNTER — Telehealth: Payer: Self-pay | Admitting: Neurology

## 2023-02-12 NOTE — Telephone Encounter (Signed)
MRI of brain and cervical spine performed last week are stable compared to over a year ago.

## 2023-02-13 NOTE — Telephone Encounter (Signed)
Patient advised.

## 2023-03-06 ENCOUNTER — Other Ambulatory Visit (HOSPITAL_BASED_OUTPATIENT_CLINIC_OR_DEPARTMENT_OTHER): Payer: Self-pay | Admitting: Nurse Practitioner

## 2023-03-06 DIAGNOSIS — Z1231 Encounter for screening mammogram for malignant neoplasm of breast: Secondary | ICD-10-CM

## 2023-03-21 ENCOUNTER — Ambulatory Visit (HOSPITAL_BASED_OUTPATIENT_CLINIC_OR_DEPARTMENT_OTHER)
Admission: RE | Admit: 2023-03-21 | Discharge: 2023-03-21 | Disposition: A | Discharge status: Home / Self Care | Payer: Medicare PPO | Source: Ambulatory Visit | Attending: Nurse Practitioner | Admitting: Nurse Practitioner

## 2023-03-21 DIAGNOSIS — Z1231 Encounter for screening mammogram for malignant neoplasm of breast: Secondary | ICD-10-CM | POA: Diagnosis present

## 2023-04-09 ENCOUNTER — Encounter (INDEPENDENT_AMBULATORY_CARE_PROVIDER_SITE_OTHER): Payer: Self-pay

## 2023-04-09 ENCOUNTER — Ambulatory Visit (INDEPENDENT_AMBULATORY_CARE_PROVIDER_SITE_OTHER): Payer: Medicare PPO | Admitting: Otolaryngology

## 2023-04-09 VITALS — Ht 62.0 in | Wt 135.0 lb

## 2023-04-09 DIAGNOSIS — H6123 Impacted cerumen, bilateral: Secondary | ICD-10-CM | POA: Insufficient documentation

## 2023-04-09 NOTE — Progress Notes (Signed)
Patient ID: Joyce Hayes, female   DOB: 18-Jun-1955, 67 y.o.   MRN: [id number]  Procedure: Bilateral cerumen disimpaction.   Indication: Cerumen impaction, resulting in ear discomfort and conductive hearing loss.   Description: The patient is placed supine on the operating table. Under the operating microscope, the right ear canal is examined and is noted to be impacted with cerumen. The cerumen is carefully removed with a combination of suction catheters, cerumen curette, and alligator forceps. After the cerumen removal, the ear canal and tympanic membrane are noted to be normal. No middle ear effusion is noted. The same procedure is then repeated on the left side without exception. The patient tolerated the procedure well.  Follow-up care:  The patient is instructed not to use Q-tips to clean the ear canals. The patient will follow up in 6 months.

## 2023-04-11 ENCOUNTER — Encounter: Payer: Self-pay | Admitting: Neurology

## 2023-04-11 ENCOUNTER — Other Ambulatory Visit (INDEPENDENT_AMBULATORY_CARE_PROVIDER_SITE_OTHER): Payer: Medicare PPO

## 2023-04-11 ENCOUNTER — Ambulatory Visit: Payer: Medicare PPO | Admitting: Neurology

## 2023-04-11 VITALS — BP 115/74 | HR 94 | Ht 62.0 in | Wt 138.0 lb

## 2023-04-11 DIAGNOSIS — R519 Headache, unspecified: Secondary | ICD-10-CM

## 2023-04-11 DIAGNOSIS — G35 Multiple sclerosis: Secondary | ICD-10-CM | POA: Diagnosis not present

## 2023-04-11 MED ORDER — TERIFLUNOMIDE 14 MG PO TABS: 1.0000 | ORAL_TABLET | Freq: Every day | ORAL | 11 refills | Status: DC

## 2023-04-11 NOTE — Progress Notes (Signed)
NEUROLOGY FOLLOW UP OFFICE NOTE  Joyce Hayes [id number]  Assessment/Plan:   1  Multiple sclerosis 2  Headache 3  Depression/anxiety   DMT:  Start teriflunomide 14mg  daily.  Prescription sent to her pharmacy Check LFTs today and monthly for next 6 months. Check CBC with diff today and in 6 months continue duloxetine, amitriptyline and olanzapine.  D3 5000 IU daily Follow up 6 months.     Subjective:  Joyce Hayes is a 67 year old -harightnded female with MS, HTN, asthma and anxiety who follows up for multiple sclerosis.   UPDATE: Current DMT:  Next Ocrevus infusion in November Current medications:  Cymbalta 60mg  QD (pain and depression), amitriptyline 10mg  QHS (sleep, headache and depression), olanzapine 5mg  QHS (sleep, paranoid ideation), diazepam 5mg  BID PRN, D3 5000 IU QD, B12 QD, CoQ-10  Due to concern for risk of infection due to her age, plan was to switch to teriflunomide.  It was approved but nobody contacted her to tell her, so she never started it.     02/04/2023 MRI BRAIN W WO:  No significant interval change in the white matter lesions present in both cerebral hemispheres. No new or enhancing lesions appreciated.  02/04/2023 MRI C-SPINE W WO:  1.  Stable appearance of the small focal T2-weighted hyperintensity at the C4-C5 level without any associated enhancement. No new lesions appreciated.  2.  Degenerative changes as described above that is most notable for moderate neuroforaminal narrowing bilaterally at C4-C5 and C5-C6.    Vision:  Residual blurred vision in right eye from optic neuritis (approx 2004).  Feels more pressure in the right eye.   Motor:  No issues but sometimes grasping with her hands feels a little weak with overuse.  Sometimes left arm twitches.   Sensory: Right big toe feels tingling again.  Comes and goes.   Pain:  Chronic neck pain.  Chronic l low back pain, right leg radiculopathy Headaches:  a pounding pain across the  forehead.  No nausea, vomiting, photophobia, phonophobia or visual disturbance.  Typically last 30 minutes and occur about once or twice a month.  Treats with Advil.  Often associated with elevated blood pressure. Gait:  Sometimes feels a little off balance. Bowel/Bladder:  Some urinary incontinence.  History of constipation currently well-managed. Fatigue:  Sometimes feels tired but nothing significant. Cognition:  Mild cognitive disorder. Mood:  Anxiety, depression - comes and goes.  Separated from her husband.  History of paranoid delusions (previously believed that her husband was planning to kill her and that FBI was working with her).   HISTORY: Diagnosed with MS in 2000 presenting with right foot numbness.  Diagnosed via MRI.  Did not have an LP.  Treated with steroids.   Past DMT:  Avonex (2000-2001, stopped due to flu-like symptoms), Rebif (2001-early 2019 (injection site reaction, depression, lost efficacy), Ocrevus (09/17/2017-2024) Other past medications:  Wellbutrin   Covid-19 Vaccine:  Moderna - second dose 10/14/2019   Imaging: 11/03/2007 MRI BRAIN:  Reportedly stable compared to prior study from 2007. 06/15/2009 MRI BRAIN:  There are several nonenhancing periventricular white matter and juxtacortical white matter hyperintensities typical of MS.  There is mild atrophy and ventricular enlargement.  Reportedly showed possible minimal progression compared with prior study from 06/20/2005. 12/11/2009 MRI C-SPINE:  Nonenhancing MS lesions, C2 and C4-5.  Minor disc disease. 12/11/2009 MRI T-SPINE:  Nonenhancing MS lesion, T2-3. 08/22/2014 MRI C-SPINE:  Nonenhancing plaque in the dorsal column at C4 and C4-5, unchanged from prior study  of 04/21/2012.  The previously described C2 lesion back in 2011 is not seen.  There is a sbutle lesion noted at T2-3. 08/22/2014 MRI T-SPINE:  Limited quality; T2-3 lesion is better seen on C-spine imaging in 2013 and 2016 and appears unchanged. 04/21/2017 MRI  BRAIN:  Unchanged from prior study of 08/22/2014 04/21/2017 MRI C-SPINE:  Unchanged from prior study of 08/22/2014 03/30/2019 MRI BRAIN:  Unchanged from 04/21/2017. 03/30/2019 MRI C-SPINE:  Unchanged from 04/21/2017, with a stable right dorsal hyperintesity at C4-5 typical of MS. 01/06/2021 MRI BRAIN W WO:  moderately advanced chronic demyelinating disease limited to the cerebral white matter with no active demyelination.   01/06/2021 MRI C-SPINE W WO:  subtle evidence of chronic demyelinating disease in the cervical spinal cord at C4-5 level. 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  Severe weaknesses in visual spatial and verbal function noted.  Moderate impairment in executive function, attention, problem solving, working memory, and global cognitive score.  Pure memory was only slightly diminished according to [employer].   No family history of MS.  Mother had Alzheimer's disease.  PAST MEDICAL HISTORY: Past Medical History:  Diagnosis Date   Anxiety    Asthma    Hypertension    Multiple sclerosis (HCC)     MEDICATIONS: Current Outpatient Medications on File Prior to Visit  Medication Sig Dispense Refill   albuterol (VENTOLIN HFA) 108 (90 Base) MCG/ACT inhaler Inhale into the lungs every 6 (six) hours as needed for wheezing or shortness of breath.     amitriptyline (ELAVIL) 10 MG tablet TAKE 1 TABLET AT BEDTIME 90 tablet 3   amLODipine (NORVASC) 5 MG tablet Take 5 mg by mouth daily.     atorvastatin (LIPITOR) 10 MG tablet Take 1 tablet by mouth at bedtime.     bisoprolol-hydrochlorothiazide (ZIAC) 5-6.25 MG tablet 1 tablet     cholecalciferol (VITAMIN D3) 25 MCG (1000 UNIT) tablet 1 T QD     cyanocobalamin 2000 MCG tablet      diazepam (VALIUM) 5 MG tablet May take second dose if needed. 1 tablet  0   DULoxetine (CYMBALTA) 60 MG capsule TAKE 1 CAPSULE EVERY DAY 90 capsule 3   estradiol (ESTRACE) 0.1 MG/GM vaginal cream Place 1 Applicatorful vaginally at bedtime.     Multiple Vitamins-Minerals (HAIR/SKIN/NAILS/BIOTIN PO) Take 500 mg by mouth. One time daily     Ocrelizumab (OCREVUS IV) Inject into the vein. (Patient not taking: Reported on 04/09/2023)     OLANZapine (ZYPREXA) 5 MG tablet Take 1 tablet (5 mg total) by mouth at bedtime. 30 tablet 5   saccharomyces boulardii (FLORASTOR) 250 MG capsule Take 250 mg by mouth daily. One daily     No current facility-administered medications on file prior to visit.    ALLERGIES: Allergies  Allergen Reactions   Chocolate    Grass Pollen(K-O-R-T-Swt Vern) Other (See Comments)   Soybean-Containing Drug Products Other (See Comments)    FAMILY HISTORY: Family History  Problem Relation Age of Onset   Dementia Mother    Hypertension Mother    Alzheimer's disease Mother    Hypertension Father    Osteoporosis Father    Hepatitis C Brother       Objective:  Blood pressure 115/74, pulse 94, height 5\' 2"  (1.575 m), weight 138 lb (62.6 kg), SpO2 97%. General: No acute distress.  Patient appears well-groomed.       Shon Millet, DO  CC: Effie Shy, MD

## 2023-04-11 NOTE — Patient Instructions (Addendum)
Start teriflunomide 14mg  daily.  Check liver function test today and again once a month for next 6 months. Check CBC with diff  today and in 6 months [address] Follow up with me in 6 months.

## 2023-04-12 LAB — HEPATIC FUNCTION PANEL
AG Ratio: 1.4 (calc) (ref 1.0–2.5)
ALT: 8 U/L (ref 6–29)
AST: 18 U/L (ref 10–35)
Albumin: 4.5 g/dL (ref 3.6–5.1)
Alkaline phosphatase (APISO): 69 U/L (ref 37–153)
Bilirubin, Direct: 0.1 mg/dL (ref 0.0–0.2)
Globulin: 3.2 g/dL (ref 1.9–3.7)
Indirect Bilirubin: 0.4 mg/dL (ref 0.2–1.2)
Total Bilirubin: 0.5 mg/dL (ref 0.2–1.2)
Total Protein: 7.7 g/dL (ref 6.1–8.1)

## 2023-04-12 LAB — CBC WITH DIFFERENTIAL/PLATELET
Absolute Lymphocytes: 1257 {cells}/uL (ref 850–3900)
Absolute Monocytes: 799 {cells}/uL (ref 200–950)
Basophils Absolute: 49 {cells}/uL (ref 0–200)
Basophils Relative: 0.8 %
Eosinophils Absolute: 238 {cells}/uL (ref 15–500)
Eosinophils Relative: 3.9 %
HCT: 41.3 % (ref 35.0–45.0)
Hemoglobin: 13.5 g/dL (ref 11.7–15.5)
MCH: 30.8 pg (ref 27.0–33.0)
MCHC: 32.7 g/dL (ref 32.0–36.0)
MCV: 94.1 fL (ref 80.0–100.0)
MPV: 11.2 fL (ref 7.5–12.5)
Monocytes Relative: 13.1 %
Neutro Abs: 3758 {cells}/uL (ref 1500–7800)
Neutrophils Relative %: 61.6 %
Platelets: 336 10*3/uL (ref 140–400)
RBC: 4.39 10*6/uL (ref 3.80–5.10)
RDW: 12 % (ref 11.0–15.0)
Total Lymphocyte: 20.6 %
WBC: 6.1 10*3/uL (ref 3.8–10.8)

## 2023-04-17 ENCOUNTER — Telehealth: Payer: Self-pay | Admitting: Neurology

## 2023-04-17 MED ORDER — TERIFLUNOMIDE 14 MG PO TABS: 1.0000 | ORAL_TABLET | Freq: Every day | ORAL | 3 refills | Status: DC

## 2023-04-17 MED ORDER — TERIFLUNOMIDE 14 MG PO TABS: 1.0000 | ORAL_TABLET | Freq: Every day | ORAL | 11 refills | Status: DC

## 2023-04-17 NOTE — Telephone Encounter (Signed)
Script sent wrong Pharmacy.  Patient advised pop up showed cheaper with mail in pharmacy.      PA team a PA is needed as well.

## 2023-04-17 NOTE — Telephone Encounter (Signed)
Caller stated pharmacy never received script that was sent on 04/11/23 for Terifunomide

## 2023-04-17 NOTE — Telephone Encounter (Signed)
Authorization already on file for this request. Authorization starting on 01/01/2023 and ending on 06/17/2023.

## 2023-04-18 NOTE — Telephone Encounter (Signed)
Per patient medication will be shipped.

## 2023-04-30 ENCOUNTER — Ambulatory Visit: Payer: Medicare PPO

## 2023-04-30 ENCOUNTER — Other Ambulatory Visit (INDEPENDENT_AMBULATORY_CARE_PROVIDER_SITE_OTHER): Payer: Medicare PPO

## 2023-04-30 DIAGNOSIS — G35 Multiple sclerosis: Secondary | ICD-10-CM | POA: Diagnosis not present

## 2023-04-30 LAB — HEPATIC FUNCTION PANEL
ALT: 9 U/L (ref 0–35)
AST: 22 U/L (ref 0–37)
Albumin: 4.4 g/dL (ref 3.5–5.2)
Alkaline Phosphatase: 77 U/L (ref 39–117)
Bilirubin, Direct: 0.1 mg/dL (ref 0.0–0.3)
Total Bilirubin: 0.5 mg/dL (ref 0.2–1.2)
Total Protein: 7.5 g/dL (ref 6.0–8.3)

## 2023-05-27 ENCOUNTER — Other Ambulatory Visit: Payer: Self-pay

## 2023-05-27 ENCOUNTER — Other Ambulatory Visit: Payer: Medicare PPO

## 2023-05-27 DIAGNOSIS — G35 Multiple sclerosis: Secondary | ICD-10-CM

## 2023-05-27 NOTE — Progress Notes (Signed)
Patients presents for labs. Lab orders reentered, and printed for patients.

## 2023-05-28 ENCOUNTER — Other Ambulatory Visit: Payer: Self-pay | Admitting: Neurology

## 2023-05-28 LAB — CBC WITH DIFFERENTIAL/PLATELET
Absolute Lymphocytes: 1086 {cells}/uL (ref 850–3900)
Absolute Monocytes: 846 {cells}/uL (ref 200–950)
Basophils Absolute: 48 {cells}/uL (ref 0–200)
Basophils Relative: 0.8 %
Eosinophils Absolute: 270 {cells}/uL (ref 15–500)
Eosinophils Relative: 4.5 %
HCT: 41 % (ref 35.0–45.0)
Hemoglobin: 13.3 g/dL (ref 11.7–15.5)
MCH: 30.9 pg (ref 27.0–33.0)
MCHC: 32.4 g/dL (ref 32.0–36.0)
MCV: 95.1 fL (ref 80.0–100.0)
MPV: 12.1 fL (ref 7.5–12.5)
Monocytes Relative: 14.1 %
Neutro Abs: 3750 {cells}/uL (ref 1500–7800)
Neutrophils Relative %: 62.5 %
Platelets: 311 10*3/uL (ref 140–400)
RBC: 4.31 10*6/uL (ref 3.80–5.10)
RDW: 11.9 % (ref 11.0–15.0)
Total Lymphocyte: 18.1 %
WBC: 6 10*3/uL (ref 3.8–10.8)

## 2023-05-28 LAB — HEPATIC FUNCTION PANEL
AG Ratio: 1.4 (calc) (ref 1.0–2.5)
ALT: 14 U/L (ref 6–29)
AST: 38 U/L — ABNORMAL HIGH (ref 10–35)
Albumin: 4.2 g/dL (ref 3.6–5.1)
Alkaline phosphatase (APISO): 73 U/L (ref 37–153)
Bilirubin, Direct: 0.1 mg/dL (ref 0.0–0.2)
Globulin: 2.9 g/dL (ref 1.9–3.7)
Indirect Bilirubin: 0.4 mg/dL (ref 0.2–1.2)
Total Bilirubin: 0.5 mg/dL (ref 0.2–1.2)
Total Protein: 7.1 g/dL (ref 6.1–8.1)

## 2023-05-29 ENCOUNTER — Telehealth: Payer: Self-pay

## 2023-05-29 DIAGNOSIS — G35 Multiple sclerosis: Secondary | ICD-10-CM

## 2023-05-29 NOTE — Telephone Encounter (Signed)
Patient advised of labs results.

## 2023-05-29 NOTE — Telephone Encounter (Signed)
-----   Message from Cira Servant sent at 05/28/2023  4:29 PM EST ----- Labs okay.  Repeat liver function tests in one month

## 2023-07-01 ENCOUNTER — Other Ambulatory Visit: Payer: Self-pay

## 2023-07-01 DIAGNOSIS — G35 Multiple sclerosis: Secondary | ICD-10-CM

## 2023-07-02 LAB — HEPATIC FUNCTION PANEL
AG Ratio: 1.7 (calc) (ref 1.0–2.5)
ALT: 11 U/L (ref 6–29)
AST: 22 U/L (ref 10–35)
Albumin: 4.5 g/dL (ref 3.6–5.1)
Alkaline phosphatase (APISO): 84 U/L (ref 37–153)
Bilirubin, Direct: 0.2 mg/dL (ref 0.0–0.2)
Globulin: 2.7 g/dL (ref 1.9–3.7)
Indirect Bilirubin: 0.5 mg/dL (ref 0.2–1.2)
Total Bilirubin: 0.7 mg/dL (ref 0.2–1.2)
Total Protein: 7.2 g/dL (ref 6.1–8.1)

## 2023-07-09 ENCOUNTER — Ambulatory Visit: Payer: Medicare PPO | Admitting: Neurology

## 2023-07-10 ENCOUNTER — Encounter: Payer: Self-pay | Admitting: Neurology

## 2023-07-14 ENCOUNTER — Ambulatory Visit: Payer: Medicare PPO | Admitting: Neurology

## 2023-07-22 ENCOUNTER — Other Ambulatory Visit: Payer: Medicare PPO

## 2023-07-23 LAB — HEPATIC FUNCTION PANEL
AG Ratio: 1.8 (calc) (ref 1.0–2.5)
ALT: 12 U/L (ref 6–29)
AST: 25 U/L (ref 10–35)
Albumin: 4.6 g/dL (ref 3.6–5.1)
Alkaline phosphatase (APISO): 78 U/L (ref 37–153)
Bilirubin, Direct: 0.2 mg/dL (ref 0.0–0.2)
Globulin: 2.5 g/dL (ref 1.9–3.7)
Indirect Bilirubin: 0.4 mg/dL (ref 0.2–1.2)
Total Bilirubin: 0.6 mg/dL (ref 0.2–1.2)
Total Protein: 7.1 g/dL (ref 6.1–8.1)

## 2023-07-25 ENCOUNTER — Other Ambulatory Visit: Payer: Self-pay

## 2023-07-25 DIAGNOSIS — F419 Anxiety disorder, unspecified: Secondary | ICD-10-CM

## 2023-07-25 DIAGNOSIS — G35 Multiple sclerosis: Secondary | ICD-10-CM

## 2023-07-25 DIAGNOSIS — R519 Headache, unspecified: Secondary | ICD-10-CM

## 2023-08-26 ENCOUNTER — Other Ambulatory Visit

## 2023-08-27 LAB — HEPATIC FUNCTION PANEL
AG Ratio: 1.8 (calc) (ref 1.0–2.5)
ALT: 11 U/L (ref 6–29)
AST: 28 U/L (ref 10–35)
Albumin: 4.6 g/dL (ref 3.6–5.1)
Alkaline phosphatase (APISO): 76 U/L (ref 37–153)
Bilirubin, Direct: 0.2 mg/dL (ref 0.0–0.2)
Globulin: 2.6 g/dL (ref 1.9–3.7)
Indirect Bilirubin: 0.5 mg/dL (ref 0.2–1.2)
Total Bilirubin: 0.7 mg/dL (ref 0.2–1.2)
Total Protein: 7.2 g/dL (ref 6.1–8.1)

## 2023-09-04 ENCOUNTER — Other Ambulatory Visit: Payer: Self-pay | Admitting: Family Medicine

## 2023-09-04 DIAGNOSIS — Z1231 Encounter for screening mammogram for malignant neoplasm of breast: Secondary | ICD-10-CM

## 2023-09-11 ENCOUNTER — Ambulatory Visit: Payer: Medicare PPO | Admitting: Neurology

## 2023-09-16 ENCOUNTER — Ambulatory Visit: Payer: Medicare PPO | Admitting: Neurology

## 2023-09-23 NOTE — Progress Notes (Unsigned)
 NEUROLOGY FOLLOW UP OFFICE NOTE  Joyce Hayes [id number]  Assessment/Plan:   1  Multiple sclerosis 2  Headache 3  Depression/anxiety   DMT:  Start teriflunomide 14mg  daily.  Repeat MRI of brain and C-spine with and without contrast in 6 months Check CBC with diff today.  LFT next month. Repeat CBC with diff, LFTs and Vit D in 6 months. continue duloxetine, amitriptyline and olanzapine.  D3 5000 IU daily Follow up 6 months (after repeat testing performed)     Subjective:  Joyce Hayes is a 68 year old -rightnded female with MS, HTN, asthma and anxiety who follows up for multiple sclerosis.   UPDATE: Current DMT:  Teriflunomide 14mg  daily (started November 2024) Current medications:  Cymbalta 60mg  QD (pain and depression), amitriptyline 10mg  QHS (sleep, headache and depression), olanzapine 5mg  QHS (sleep, paranoid ideation), diazepam 5mg  BID PRN, D3 5000 IU QD, B12 QD, CoQ-10  LFTs have been normal for the past 6 months.  On 3/11, t bili 0.7, ALP 76, AST 28, ALT 11.    Tolerating the new DMT. Since stopping Ocrevus: notes more lethargy notes pins and needles pain in her right leg and sometimes left leg.   Also feels like she is walking more unsteady.   But overall manageable  02/04/2023 MRI BRAIN W WO:  No significant interval change in the white matter lesions present in both cerebral hemispheres. No new or enhancing lesions appreciated.  02/04/2023 MRI C-SPINE W WO:  1.  Stable appearance of the small focal T2-weighted hyperintensity at the C4-C5 level without any associated enhancement. No new lesions appreciated.  2.  Degenerative changes as described above that is most notable for moderate neuroforaminal narrowing bilaterally at C4-C5 and C5-C6.    Vision:  Residual blurred vision in right eye from optic neuritis (approx 2004).  Feels more pressure in the right eye.   Motor:  No issues but sometimes grasping with her hands feels a little weak with  overuse.  Sometimes left arm twitches.   Sensory: Right big toe feels tingling again.  Comes and goes.   Pain:  Chronic neck pain.  Chronic l low back pain, right leg radiculopathy Headaches:  a pounding pain across the forehead.  No nausea, vomiting, photophobia, phonophobia or visual disturbance.  Typically last 30 minutes and occur about once or twice a month.  Treats with Advil.  Often associated with elevated blood pressure. Gait:  Sometimes feels a little off balance. Bowel/Bladder:  Some urinary incontinence.  History of constipation currently well-managed. Fatigue:  Sometimes feels tired but nothing significant. Cognition:  Mild cognitive disorder. Mood:  Anxiety, depression - comes and goes.  Separated from her husband.  History of paranoid delusions (previously believed that her husband was planning to kill her and that FBI was working with her).   HISTORY: Diagnosed with MS in 2000 presenting with right foot numbness.  Diagnosed via MRI.  Did not have an LP.  Treated with steroids.   Past DMT:  Avonex (2000-2001, stopped due to flu-like symptoms), Rebif (2001-early 2019 (injection site reaction, depression, lost efficacy), Ocrevus (09/17/2017-2024) Other past medications:  Wellbutrin   Covid-19 Vaccine:  Moderna - second dose 10/14/2019   Imaging: 11/03/2007 MRI BRAIN:  Reportedly stable compared to prior study from 2007. 06/15/2009 MRI BRAIN:  There are several nonenhancing periventricular white matter and juxtacortical white matter hyperintensities typical of MS.  There is mild atrophy and ventricular enlargement.  Reportedly showed possible minimal progression compared with prior study from  06/20/2005. 12/11/2009 MRI C-SPINE:  Nonenhancing MS lesions, C2 and C4-5.  Minor disc disease. 12/11/2009 MRI T-SPINE:  Nonenhancing MS lesion, T2-3. 08/22/2014 MRI C-SPINE:  Nonenhancing plaque in the dorsal column at C4 and C4-5, unchanged from prior study of 04/21/2012.  The previously  described C2 lesion back in 2011 is not seen.  There is a sbutle lesion noted at T2-3. 08/22/2014 MRI T-SPINE:  Limited quality; T2-3 lesion is better seen on C-spine imaging in 2013 and 2016 and appears unchanged. 04/21/2017 MRI BRAIN:  Unchanged from prior study of 08/22/2014 04/21/2017 MRI C-SPINE:  Unchanged from prior study of 08/22/2014 03/30/2019 MRI BRAIN:  Unchanged from 04/21/2017. 03/30/2019 MRI C-SPINE:  Unchanged from 04/21/2017, with a stable right dorsal hyperintesity at C4-5 typical of MS. 01/06/2021 MRI BRAIN W WO:  moderately advanced chronic demyelinating disease limited to the cerebral white matter with no active demyelination.   01/06/2021 MRI C-SPINE W WO:  subtle evidence of chronic demyelinating disease in the cervical spinal cord at C4-5 level. 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  Severe weaknesses in visual spatial and verbal function noted.  Moderate impairment in executive function, attention, problem solving, working memory, and global cognitive score.  Pure memory was only slightly diminished according to [employer].   No family history of MS.  Mother had Alzheimer's disease.  PAST MEDICAL HISTORY: Past Medical History:  Diagnosis Date   Anxiety    Asthma    Hypertension    Multiple sclerosis (HCC)     MEDICATIONS: Current Outpatient Medications on File Prior to Visit  Medication Sig Dispense Refill   albuterol (VENTOLIN HFA) 108 (90 Base) MCG/ACT inhaler Inhale into the lungs every 6 (six) hours as needed for wheezing or shortness of breath.     amitriptyline (ELAVIL) 10 MG tablet TAKE 1 TABLET AT BEDTIME 90 tablet 3   amLODipine (NORVASC) 5 MG tablet Take 5 mg by mouth daily.     atorvastatin (LIPITOR) 10 MG tablet Take 1 tablet by mouth at bedtime.      bisoprolol-hydrochlorothiazide (ZIAC) 5-6.25 MG tablet 1 tablet     cholecalciferol (VITAMIN D3) 25 MCG (1000 UNIT) tablet 1 T QD     Coenzyme Q10 (CO Q 10 PO) Take by mouth.     cyanocobalamin 2000 MCG tablet      diazepam (VALIUM) 5 MG tablet May take second dose if needed. 1 tablet 0   DULoxetine (CYMBALTA) 60 MG capsule TAKE 1 CAPSULE EVERY DAY 90 capsule 3   estradiol (ESTRACE) 0.1 MG/GM vaginal cream Place 1 Applicatorful vaginally at bedtime.     Multiple Vitamins-Minerals (HAIR/SKIN/NAILS/BIOTIN PO) Take 500 mg by mouth. One time daily     OLANZapine (ZYPREXA) 5 MG tablet Take 1 tablet (5 mg total) by mouth at bedtime. 30 tablet 5   saccharomyces boulardii (FLORASTOR) 250 MG capsule Take 250 mg by mouth daily. One daily     Teriflunomide 14 MG TABS Take 1 tablet (14 mg total) by mouth daily. 90 tablet 3   No current facility-administered medications on file prior to visit.    ALLERGIES: Allergies  Allergen Reactions   Chocolate    Grass Pollen(K-O-R-T-Swt Vern) Other (See Comments)   Soybean-Containing Drug Products Other (See Comments)    FAMILY HISTORY: Family History  Problem Relation Age of Onset   Dementia Mother  Hypertension Mother    Alzheimer's disease Mother    Hypertension Father    Osteoporosis Father    Hepatitis C Brother       Objective:  Blood pressure 115/74, pulse 94, height 5\' 2"  (1.575 m), weight 138 lb (62.6 kg), SpO2 97%. General: No acute distress.  Patient appears well-groomed.       Shon Millet, DO  CC: Effie Shy, MD

## 2023-09-24 ENCOUNTER — Encounter: Payer: Self-pay | Admitting: Neurology

## 2023-09-24 ENCOUNTER — Ambulatory Visit: Payer: Medicare PPO | Admitting: Neurology

## 2023-09-24 ENCOUNTER — Other Ambulatory Visit

## 2023-09-24 VITALS — BP 136/76 | HR 81 | Ht 62.0 in | Wt 140.4 lb

## 2023-09-24 DIAGNOSIS — G35 Multiple sclerosis: Secondary | ICD-10-CM

## 2023-09-24 LAB — CBC WITH DIFFERENTIAL/PLATELET
Absolute Lymphocytes: 1109 {cells}/uL (ref 850–3900)
Absolute Monocytes: 582 {cells}/uL (ref 200–950)
Basophils Absolute: 45 {cells}/uL (ref 0–200)
Basophils Relative: 0.4 %
Eosinophils Absolute: 101 {cells}/uL (ref 15–500)
Eosinophils Relative: 0.9 %
HCT: 41.6 % (ref 35.0–45.0)
Hemoglobin: 13.2 g/dL (ref 11.7–15.5)
MCH: 30.2 pg (ref 27.0–33.0)
MCHC: 31.7 g/dL — ABNORMAL LOW (ref 32.0–36.0)
MCV: 95.2 fL (ref 80.0–100.0)
MPV: 12.3 fL (ref 7.5–12.5)
Monocytes Relative: 5.2 %
Neutro Abs: 9363 {cells}/uL — ABNORMAL HIGH (ref 1500–7800)
Neutrophils Relative %: 83.6 %
Platelets: 304 10*3/uL (ref 140–400)
RBC: 4.37 10*6/uL (ref 3.80–5.10)
RDW: 11.9 % (ref 11.0–15.0)
Total Lymphocyte: 9.9 %
WBC: 11.2 10*3/uL — ABNORMAL HIGH (ref 3.8–10.8)

## 2023-09-24 MED ORDER — OLANZAPINE 5 MG PO TABS: 5.0000 mg | ORAL_TABLET | Freq: Every day | ORAL | 1 refills | Status: DC

## 2023-09-24 NOTE — Patient Instructions (Signed)
 Continue teriflunomide 14mg  daily Repeat MRI brain and C-spine with and without contrast in 6 months Check CBC with diff today Repeat CBC with diff, LFTs and vit D in 6 months

## 2023-09-29 ENCOUNTER — Telehealth: Payer: Self-pay | Admitting: Neurology

## 2023-09-29 NOTE — Telephone Encounter (Signed)
 LMOVM for patient to call the office back.

## 2023-09-29 NOTE — Progress Notes (Signed)
 Patient advised.

## 2023-09-29 NOTE — Telephone Encounter (Signed)
 Pt called in and left a message. She is returning [facility] call

## 2023-09-29 NOTE — Progress Notes (Signed)
 LMOVm for patient, Please call the office back.

## 2023-09-29 NOTE — Telephone Encounter (Signed)
 Pt. Calling back for results call

## 2023-09-29 NOTE — Telephone Encounter (Signed)
 Pt LMOM calling Sheena back

## 2023-10-08 ENCOUNTER — Ambulatory Visit (INDEPENDENT_AMBULATORY_CARE_PROVIDER_SITE_OTHER): Payer: Medicare PPO

## 2023-10-14 ENCOUNTER — Ambulatory Visit: Payer: Medicare PPO | Admitting: Neurology

## 2023-11-11 ENCOUNTER — Ambulatory Visit (INDEPENDENT_AMBULATORY_CARE_PROVIDER_SITE_OTHER): Admitting: Otolaryngology

## 2023-11-11 VITALS — BP 134/82 | HR 88

## 2023-11-11 DIAGNOSIS — H6123 Impacted cerumen, bilateral: Secondary | ICD-10-CM | POA: Diagnosis not present

## 2023-11-11 NOTE — Progress Notes (Signed)
Patient ID: Joyce Hayes, female   DOB: 18-Jun-1955, 68 y.o.   MRN: [id number]  Procedure: Bilateral cerumen disimpaction.   Indication: Cerumen impaction, resulting in ear discomfort and conductive hearing loss.   Description: The patient is placed supine on the operating table. Under the operating microscope, the right ear canal is examined and is noted to be impacted with cerumen. The cerumen is carefully removed with a combination of suction catheters, cerumen curette, and alligator forceps. After the cerumen removal, the ear canal and tympanic membrane are noted to be normal. No middle ear effusion is noted. The same procedure is then repeated on the left side without exception. The patient tolerated the procedure well.  Follow-up care:  The patient is instructed not to use Q-tips to clean the ear canals. The patient will follow up in 6 months.

## 2024-01-17 LAB — COLOGUARD: COLOGUARD: NEGATIVE

## 2024-03-10 ENCOUNTER — Other Ambulatory Visit: Payer: Self-pay | Admitting: Neurology

## 2024-03-15 ENCOUNTER — Other Ambulatory Visit: Payer: Self-pay | Admitting: Neurology

## 2024-03-15 ENCOUNTER — Telehealth: Payer: Self-pay | Admitting: Neurology

## 2024-03-15 MED ORDER — DIAZEPAM 5 MG PO TABS: ORAL_TABLET | ORAL | 0 refills | Status: AC

## 2024-03-15 NOTE — Telephone Encounter (Signed)
 Pt states that she has a MRI sch for 03-30-24 and needs something to help her relax called into the  [facility] on [address]   She states that she usually gets  2 valium 

## 2024-03-16 ENCOUNTER — Telehealth: Payer: Self-pay | Admitting: Pharmacy Technician

## 2024-03-16 ENCOUNTER — Telehealth: Payer: Self-pay

## 2024-03-16 ENCOUNTER — Other Ambulatory Visit (HOSPITAL_COMMUNITY): Payer: Self-pay

## 2024-03-16 NOTE — Telephone Encounter (Signed)
 PA has been submitted, and telephone encounter has been created. Please see telephone encounter dated 9.30.25.

## 2024-03-16 NOTE — Telephone Encounter (Signed)
 Pharmacy Patient Advocate Encounter  Received notification from [employer] that Prior Authorization for TERIFLUNOMIDE  14MG  has been CANCELLED due to    TEST BILLING WITH [facility] RETURNS A COPAY OF $22.58.

## 2024-03-17 NOTE — Telephone Encounter (Signed)
 Pharmacy Patient Advocate Encounter  Received notification from [employer] that Prior Authorization for TERIFLUNOMIDE  14MG  has been CANCELLED due to    TEST BILLING WITH [facility] RETURNS A COPAY OF $22.58.

## 2024-03-23 ENCOUNTER — Encounter: Payer: Self-pay | Admitting: Neurology

## 2024-03-25 ENCOUNTER — Telehealth: Payer: Self-pay | Admitting: Neurology

## 2024-03-25 DIAGNOSIS — G35D Multiple sclerosis, unspecified: Secondary | ICD-10-CM

## 2024-03-25 NOTE — Telephone Encounter (Signed)
 Pt stated that the Imaging Department scheduled her  Brain and Spine MRI for  October 14, 25 but then  they reschedule it to November 18,25.

## 2024-03-29 ENCOUNTER — Other Ambulatory Visit

## 2024-03-30 ENCOUNTER — Other Ambulatory Visit

## 2024-03-30 LAB — CBC WITH DIFFERENTIAL/PLATELET
Absolute Lymphocytes: 2124 {cells}/uL (ref 850–3900)
Absolute Monocytes: 625 {cells}/uL (ref 200–950)
Basophils Absolute: 53 {cells}/uL (ref 0–200)
Basophils Relative: 0.9 %
Eosinophils Absolute: 230 {cells}/uL (ref 15–500)
Eosinophils Relative: 3.9 %
HCT: 40.1 % (ref 35.0–45.0)
Hemoglobin: 13 g/dL (ref 11.7–15.5)
MCH: 31.3 pg (ref 27.0–33.0)
MCHC: 32.4 g/dL (ref 32.0–36.0)
MCV: 96.6 fL (ref 80.0–100.0)
MPV: 11.9 fL (ref 7.5–12.5)
Monocytes Relative: 10.6 %
Neutro Abs: 2867 {cells}/uL (ref 1500–7800)
Neutrophils Relative %: 48.6 %
Platelets: 272 Thousand/uL (ref 140–400)
RBC: 4.15 Million/uL (ref 3.80–5.10)
RDW: 11.8 % (ref 11.0–15.0)
Total Lymphocyte: 36 %
WBC: 5.9 Thousand/uL (ref 3.8–10.8)

## 2024-03-30 LAB — VITAMIN D 25 HYDROXY (VIT D DEFICIENCY, FRACTURES): Vit D, 25-Hydroxy: 101 ng/mL — ABNORMAL HIGH (ref 30–100)

## 2024-03-30 LAB — HEPATIC FUNCTION PANEL
AG Ratio: 1.7 (calc) (ref 1.0–2.5)
ALT: 10 U/L (ref 6–29)
AST: 22 U/L (ref 10–35)
Albumin: 4.5 g/dL (ref 3.6–5.1)
Alkaline phosphatase (APISO): 62 U/L (ref 37–153)
Bilirubin, Direct: 0.1 mg/dL (ref 0.0–0.2)
Globulin: 2.6 g/dL (ref 1.9–3.7)
Indirect Bilirubin: 0.5 mg/dL (ref 0.2–1.2)
Total Bilirubin: 0.6 mg/dL (ref 0.2–1.2)
Total Protein: 7.1 g/dL (ref 6.1–8.1)

## 2024-03-31 ENCOUNTER — Ambulatory Visit: Admitting: Internal Medicine

## 2024-03-31 ENCOUNTER — Encounter: Payer: Self-pay | Admitting: Internal Medicine

## 2024-03-31 VITALS — BP 113/74 | HR 75 | Temp 97.6°F | Ht 62.0 in | Wt 136.0 lb

## 2024-03-31 DIAGNOSIS — J301 Allergic rhinitis due to pollen: Secondary | ICD-10-CM

## 2024-03-31 DIAGNOSIS — Z7722 Contact with and (suspected) exposure to environmental tobacco smoke (acute) (chronic): Secondary | ICD-10-CM | POA: Diagnosis not present

## 2024-03-31 DIAGNOSIS — J4520 Mild intermittent asthma, uncomplicated: Secondary | ICD-10-CM

## 2024-03-31 DIAGNOSIS — J302 Other seasonal allergic rhinitis: Secondary | ICD-10-CM | POA: Diagnosis not present

## 2024-03-31 DIAGNOSIS — J45909 Unspecified asthma, uncomplicated: Secondary | ICD-10-CM

## 2024-03-31 LAB — POCT EXHALED NITRIC OXIDE: FeNO level (ppb): 45

## 2024-03-31 MED ORDER — FLUTICASONE-SALMETEROL 250-50 MCG/ACT IN AEPB: 1.0000 | INHALATION_SPRAY | Freq: Two times a day (BID) | RESPIRATORY_TRACT | 5 refills | Status: AC

## 2024-03-31 NOTE — Patient Instructions (Addendum)
 It was a pleasure to see you today!  Please schedule follow up with Dr Pleas in 6 months.  If my schedule is not open yet, we will contact you with a reminder closer to that time. Please call [phone] if you haven't heard from us  a month before, and always call us  sooner if issues or concerns arise. You can also send us  a message through MyChart, but but aware that this is not to be used for urgent issues and it may take up to 5-7 days to receive a reply. Please be aware that you will likely be able to view your results before I have a chance to respond to them. Please give us  5 business days to respond to any non-urgent results.     -MILD INTERMITTENT ASTHMA WITH ALLERGIC COMPONENT: Asthma is a condition where your airways narrow and swell, producing extra mucus, which can make breathing difficult. Your asthma is mild and occurs intermittently, often triggered by allergies. We are prescribing a Wixela inhaler to use one puff in the morning and one at night as needed. Remember to gargle after using the steroid inhaler to prevent thrush. Continue using your albuterol  inhaler if symptoms persist beyond [place] use. If your symptoms are not controlled, contact us  as you may need prednisone. We also discussed the importance of controlling allergies and encouraging your spouse to quit smoking to reduce triggers.  I recommend starting to use the inhaler daily as prescribed if you have symptoms of asthma such as chest tightness, wheezing, coughing, shortness of breath. You should also start using it if you have exposure to a sick contact, worsening allergies, or any other trigger for your asthma. I recommend you keep using it even after your respiratory symptoms resolve for 3-4 days. The goal of this therapy is to prevent your symptoms from becoming a flare severe enough to require steroids like prednisone.   Please call our office if using this inhaler on an as needed basis is not sufficient. You might need  to be seen sooner than our scheduled follow up.   -SEASONAL ALLERGIC RHINITIS: Seasonal allergic rhinitis is an allergic reaction to pollen that causes itchy, watery eyes, and a runny nose, which can worsen asthma symptoms. We recommend using over-the-counter antihistamines like Claritin as needed and suggest nasal sprays like Flonase or Nasonex for better symptom management.   By learning about asthma and how it can be controlled, you take an important step toward managing this disease. Work closely with your asthma care team to learn all you can about your asthma, how to avoid triggers, what your medications do, and how to take them correctly. With proper care, you can live free of asthma symptoms and maintain a normal, healthy lifestyle.   What is asthma? Asthma is a chronic disease that affects the airways of the lungs. During normal breathing, the bands of muscle that surround the airways are relaxed and air moves freely. During an asthma episode or attack, there are three main changes that stop air from moving easily through the airways: The bands of muscle that surround the airways tighten and make the airways narrow. This tightening is called bronchospasm.  The lining of the airways becomes swollen or inflamed.  The cells that line the airways produce more mucus, which is thicker than normal and clogs the airways.  These three factors - bronchospasm, inflammation, and mucus production - cause symptoms such as difficulty breathing, wheezing, and coughing.  What are the most common symptoms  of asthma? Asthma symptoms are not the same for everyone. They can even change from episode to episode in the same person. Also, you may have only one symptom of asthma, such as cough, but another person may have all the symptoms of asthma. It is important to know all the symptoms of asthma and to be aware that your asthma can present in any of these ways at any time. The most common symptoms  include: Coughing, especially at night  Shortness of breath  Wheezing  Chest tightness, pain, or pressure   Who is affected by asthma? Asthma affects 22 million Americans; about 6 million of these are children under age 50. People who have a family history of asthma have an increased risk of developing the disease. Asthma is also more common in people who have allergies or who are exposed to tobacco smoke. However, anyone can develop asthma at any time. Some people may have asthma all of their lives, while others may develop it as adults.  What causes asthma? The airways in a person with asthma are very sensitive and react to many things, or triggers. Contact with these triggers causes asthma symptoms. One of the most important parts of asthma control is to identify your triggers and then avoid them when possible. The only trigger you do not want to avoid is exercise. Pre-treatment with medicines before exercise can allow you to stay active yet avoid asthma symptoms. Common asthma triggers include: Infections (colds, viruses, flu, sinus infections)  Exercise  Weather (changes in temperature and/or humidity, cold air)  Tobacco smoke  Allergens (dust mites, pollens, pets, mold spores, cockroaches, and sometimes foods)  Irritants (strong odors from cleaning products, perfume, wood smoke, air pollution)  Strong emotions such as crying or laughing hard  Some medications   How is asthma diagnosed? To diagnose asthma, your doctor will first review your medical history, family history, and symptoms. Your doctor will want to know any past history of breathing problems you may have had, as well as a family history of asthma, allergies, eczema (a bumpy, itchy skin rash caused by allergies), or other lung disease. It is important that you describe your symptoms in detail (cough, wheeze, shortness of breath, chest tightness), including when and how often they occur. The doctor will perform a physical  examination and listen to your heart and lungs. He or she may also order breathing tests, allergy tests, blood tests, and chest and sinus X-rays. The tests will find out if you do have asthma and if there are any other conditions that are contributing factors.  How is asthma treated? Asthma can be controlled, but not cured. It is not normal to have frequent symptoms, trouble sleeping, or trouble completing tasks. Appropriate asthma care will prevent symptoms and visits to the emergency room and hospital. Asthma medicines are one of the mainstays of asthma treatment. The drugs used to treat asthma are explained below.  Anti-inflammatories: These are the most important drugs for most people with asthma. Anti-inflammatory drugs reduce swelling and mucus production in the airways. As a result, airways are less sensitive and less likely to react to triggers. These medications need to be taken daily and may need to be taken for several weeks before they begin to control asthma. Anti-inflammatory medicines lead to fewer symptoms, better airflow, less sensitive airways, less airway damage, and fewer asthma attacks. If taken every day, they CONTROL or prevent asthma symptoms.   Bronchodilators: These drugs relax the muscle bands that tighten around  the airways. This action opens the airways, letting more air in and out of the lungs and improving breathing. Bronchodilators also help clear mucus from the lungs. As the airways open, the mucus moves more freely and can be coughed out more easily. In short-acting forms, bronchodilators RELIEVE or stop asthma symptoms by quickly opening the airways and are very helpful during an asthma episode. In long-acting forms, bronchodilators provide CONTROL of asthma symptoms and prevent asthma episodes.  Asthma drugs can be taken in a variety of ways. Inhaling the medications by using a metered dose inhaler, dry powder inhaler, or nebulizer is one way of taking asthma medicines.  Oral medicines (pills or liquids you swallow) may also be prescribed.  Asthma severity Asthma is classified as either intermittent (comes and goes) or persistent (lasting). Persistent asthma is further described as being mild, moderate, or severe. The severity of asthma is based on how often you have symptoms both during the day and night, as well as by the results of lung function tests and by how well you can perform activities. The severity of asthma refers to how intense or strong your asthma is.  Asthma control Asthma control is the goal of asthma treatment. Regardless of your asthma severity, it may or may not be controlled. Asthma control means: You are able to do everything you want to do at work and home  You have no (or minimal) asthma symptoms  You do not wake up from your sleep or earlier than usual in the morning due to asthma  You rarely need to use your reliever medicine (inhaler)  Another major part of your treatment is that you are happy with your asthma care and believe your asthma is controlled.  Monitoring symptoms A key part of treatment is keeping track of how well your lungs are working. Monitoring your symptoms, what they are, how and when they happen, and how severe they are, is an important part of being able to control your asthma.  Sometimes asthma is monitored using a peak flow meter. A peak flow (PF) meter measures how fast the air comes out of your lungs. It can help you know when your asthma is getting worse, sometimes even before you have symptoms. By taking daily peak flow readings, you can learn when to adjust medications to keep asthma under good control. It is also used to create your asthma action plan (see below). Your doctor can use your peak flow readings to adjust your treatment plan in some cases.  Asthma Action Plan Based on your history and asthma severity, you and your doctor will develop a care plan called an "asthma action plan." The asthma  action plan describes when and how to use your medicines, actions to take when asthma worsens, and when to seek emergency care. Make sure you understand this plan. If you do not, ask your asthma care provider any questions you may have. Your asthma action plan is one of the keys to controlling asthma. Keep it readily available to remind you of what you need to do every day to control asthma and what you need to do when symptoms occur.  Goals of asthma therapy These are the goals of asthma treatment: Live an active, normal life  Prevent chronic and troublesome symptoms  Attend work or school every day  Perform daily activities without difficulty  Stop urgent visits to the doctor, emergency department, or hospital  Use and adjust medications to control asthma with few or no side effects

## 2024-03-31 NOTE — Progress Notes (Deleted)
 Joyce Hayes    [id number]    1955/11/25  Primary Care Physician:Chow, Prentice KIDD, MD  Referring Physician: Alec House, MD [address] [place],  [place] [address] Reason for Consultation: wheezing Date of Consultation: 03/31/2024  Chief complaint:   Chief Complaint  Patient presents with   Consult   Asthma    Wheezing. Started      HPI:  *** Social history:  Occupation: retired, Runner, broadcasting/film/video, IT consultant Exposures: lives at home with husband dog.  Smoking history:ongoing passive smoke exposure in husband  Social History   Occupational History   Not on file  Tobacco Use   Smoking status: Never   Smokeless tobacco: Never  Vaping Use   Vaping status: Never Used  Substance and Sexual Activity   Alcohol use: Yes    Alcohol/week: 1.0 standard drink of alcohol    Types: 1 Glasses of wine per week    Comment: socially   Drug use: Not Currently   Sexual activity: Yes    Partners: Male    Birth control/protection: None    Relevant family history:  Family History  Problem Relation Age of Onset   Dementia Mother    Hypertension Mother    Alzheimer's disease Mother    Hypertension Father    Osteoporosis Father    Hepatitis C Brother     Past Medical History:  Diagnosis Date   Anxiety    Asthma    Hypertension    Multiple sclerosis     No past surgical history on file.   Physical Exam: Blood pressure (!) 132/90, pulse 75, temperature 97.6 F (36.4 C), temperature source Oral, height 5' 2 (1.575 m), weight 136 lb (61.7 kg), SpO2 95%. Gen:      No acute distress ENT:  ***no nasal polyps, mucus membranes moist Lungs:    No increased respiratory effort, symmetric chest wall excursion, clear to auscultation bilaterally, ***no wheezes or crackles CV:         Regular rate and rhythm; no murmurs, rubs, or gallops.  No pedal edema Abd:      + bowel sounds; soft, non-tender; no distension MSK: no acute synovitis of DIP or PIP joints, no mechanics  hands.  Skin:      Warm and dry; no rashes*** Neuro: normal speech, no focal facial asymmetry Psych: alert and oriented x3, normal mood and affect   Data Reviewed/Medical Decision Making:  Independent interpretation of tests: Imaging:  Review of patient's *** images revealed ***. The patient's images have been independently reviewed by me.    PFTs: I have personally reviewed the patient's PFTs and ***     No data to display          Labs: ***  Immunization status:   There is no immunization history on file for this patient.   I reviewed prior external note(s) from ***  I reviewed the result(s) of the labs and imaging as noted above.   I have ordered ***  Discussion of management or test interpretation with another colleague ***.   Assessment:  *** 1 or more chronic illnesses with severe exacerbation, progression, or side effects of treatment;  *** 1 acute or chronic illness or injury that poses a threat to life or bodily function  Plan/Recommendations:   We discussed disease management and progression at length today.   I spent *** minutes in the care of this patient today including pre-charting, chart review, review of results, face-to-face care,  coordination of care and communication with consultants etc.).  [phone]  15-29 minutes or Straightforward MDM  00796 30-44 minutes or Low level MDM  00795 45-59 minutes or Moderate level MDM  00794 60-74 minutes or High level MDM   Return to Care: No follow-ups on file.  Verdon Gore, MD Pulmonary and Critical Care Medicine [facility] Office:[phone]  CC: Alec House, MD

## 2024-03-31 NOTE — Progress Notes (Signed)
 Joyce Hayes    [id number]    03/01/1956  Primary Care Physician:Chow, Prentice KIDD, MD  Referring Physician: Alec House, MD [address], [address] [place],  [place] [address] Reason for Consultation: wheezing Date of Consultation: 04/02/2024  Chief complaint:   Chief Complaint  Patient presents with   Consult   Asthma    Wheezing. Started 2-3 months ago.     HPI: Discussed the use of AI scribe software for clinical note transcription with the patient, who gave verbal consent to proceed.  History of Present Illness Eleanna Theilen is a 68 year old female with asthma who presents with recurrent wheezing and shortness of breath. She was referred to evaluate the recurrence of asthma symptoms.  She has experienced recurrent wheezing episodes for which prednisone was prescribed on two occasions. Diagnosed with asthma in childhood, she had poorly controlled symptoms through college, often waking up at night wheezing or coughing. Over time, her asthma improved, and she currently uses an albuterol  sulfate inhaler only occasionally.  In her adult life, she experiences asthma symptoms approximately once a year, characterized by breathing problems, shortness of breath, wheezing, and chest tightness. Prednisone has been effective in alleviating these symptoms. Her last episode occurred two to three months ago. Between episodes, she does not experience daily symptoms such as chest tightness, wheezing, or shortness of breath, and she is able to perform all desired activities without limitation.  She has seasonal allergies but does not take regular medication for them. She occasionally uses Claritin when symptoms worsen, which provides relief.  Her past medical history includes multiple sclerosis, for which she is currently on treatment, and high blood pressure. She has no history of surgeries.  Family history reveals that her mother developed asthma later in life, and her  daughter has been diagnosed with recurrent bronchitis. She has never smoked but has been exposed to secondhand smoke from her father and spouse, which she believes may trigger her asthma symptoms.  She is retired, having worked as a Retail banker, and lives with her husband and a dog. She reports no allergies to animals. No daily chest tightness, wheezing, shortness of breath, or coughing.    Social history:  Occupation: Exposures: Smoking history:  Social History   Occupational History   Not on file  Tobacco Use   Smoking status: Never    Passive exposure: Current   Smokeless tobacco: Never  Vaping Use   Vaping status: Never Used  Substance and Sexual Activity   Alcohol use: Yes    Alcohol/week: 1.0 standard drink of alcohol    Types: 1 Glasses of wine per week    Comment: socially   Drug use: Not Currently   Sexual activity: Yes    Partners: Male    Birth control/protection: None    Relevant family history:  Family History  Problem Relation Age of Onset   Dementia Mother    Hypertension Mother    Alzheimer's disease Mother    Allergic Disorder Mother    Asthma Mother    Hypertension Father    Osteoporosis Father    Hepatitis C Brother    Asthma Daughter     Past Medical History:  Diagnosis Date   Anxiety    Asthma    Hypertension    Multiple sclerosis     History reviewed. No pertinent surgical history.   Physical Exam: Blood pressure 113/74, pulse 75, temperature 97.6 F (36.4 C), temperature source Oral,  height 5' 2 (1.575 m), weight 136 lb (61.7 kg), SpO2 95%. Gen:      No acute distress ENT:  no nasal polyps, mucus membranes moist Lungs:    No increased respiratory effort, symmetric chest wall excursion, clear to auscultation bilaterally, no wheezes or crackles CV:         Regular rate and rhythm; no murmurs, rubs, or gallops.  No pedal edema Abd:      + bowel sounds; soft, non-tender; no distension MSK: no acute synovitis of DIP or  PIP joints, no mechanics hands.  Skin:      Warm and dry; no rashes Neuro: normal speech, no focal facial asymmetry Psych: alert and oriented x3, normal mood and affect   Data Reviewed/Medical Decision Making:  Independent interpretation of tests: Imaging:   PFTs:  Labs:  Lab Results  Component Value Date   WBC 5.9 03/29/2024   HGB 13.0 03/29/2024   HCT 40.1 03/29/2024   MCV 96.6 03/29/2024   PLT 272 03/29/2024     Immunization status:   There is no immunization history on file for this patient.   I reviewed prior external note(s) from [facility] outpatient visits, ENT and cardiology  I reviewed the result(s) of the labs and imaging as noted above.   I have ordered exhaled nitric oxide testing  Assessment and Plan Assessment & Plan Mild intermittent asthma with allergic component Asthma is mild intermittent with allergic triggers, likely from secondhand smoke. Exhaled nitric oxide elevated at 41, indicating allergic inflammation. Current treatment with albuterol  inhaler as needed. Prednisone not ideal due to side effects. Discussed steroid inhalers and biologics as alternatives. - Prescribe Wixela inhaler, one puff morning and night as needed. - Instruct to gargle after steroid inhaler use to prevent thrush. - Use albuterol  inhaler if symptoms persist beyond [place] use. - Contact doctor if symptoms not controlled, may require prednisone. - Discussed allergy control to improve asthma management. - Encourage spouse to quit smoking to reduce triggers.  Seasonal allergic rhinitis Seasonal allergies with symptoms of itchy, watery eyes, and runny nose may exacerbate asthma. - Recommend over-the-counter antihistamines like Claritin as needed. - Suggest nasal sprays like Flonase or Nasonex for symptom management.    Return to Care: Return in about 6 months (around 09/29/2024) for Dr. Pleas.  Verdon Gore, MD Pulmonary and Critical Care Medicine [facility] [phone]  CC: Alec House, MD

## 2024-04-02 ENCOUNTER — Encounter: Payer: Self-pay | Admitting: Internal Medicine

## 2024-04-11 ENCOUNTER — Other Ambulatory Visit: Payer: Self-pay | Admitting: Neurology

## 2024-04-11 DIAGNOSIS — G35D Multiple sclerosis, unspecified: Secondary | ICD-10-CM

## 2024-04-13 ENCOUNTER — Ambulatory Visit: Admitting: Neurology

## 2024-04-23 ENCOUNTER — Telehealth: Payer: Self-pay | Admitting: Neurology

## 2024-04-23 NOTE — Telephone Encounter (Signed)
 Per patient she wants to use her [employer] for her next visit.   Advised patient please bring her [employer] information to the office before her visit.

## 2024-04-23 NOTE — Telephone Encounter (Signed)
 [facility] Call ID: [id number]  Caller Name: [place]  [facility]: [phone]  Reason for the call: Caller states she has [employer] and needs a pre auth that needs faxed over. Fax is [phone]

## 2024-04-24 ENCOUNTER — Other Ambulatory Visit: Payer: Self-pay | Admitting: Neurology

## 2024-05-03 ENCOUNTER — Other Ambulatory Visit: Payer: Self-pay | Admitting: Neurology

## 2024-05-03 DIAGNOSIS — G35D Multiple sclerosis, unspecified: Secondary | ICD-10-CM

## 2024-05-04 ENCOUNTER — Ambulatory Visit
Admission: RE | Admit: 2024-05-04 | Discharge: 2024-05-04 | Disposition: A | Source: Ambulatory Visit | Attending: Neurology

## 2024-05-04 ENCOUNTER — Ambulatory Visit
Admission: RE | Admit: 2024-05-04 | Discharge: 2024-05-04 | Disposition: A | Discharge status: Home / Self Care | Source: Ambulatory Visit | Attending: Neurology | Admitting: Neurology

## 2024-05-04 DIAGNOSIS — G35D Multiple sclerosis, unspecified: Secondary | ICD-10-CM

## 2024-05-04 MED ORDER — GADOPICLENOL 0.5 MMOL/ML IV SOLN
7.5000 mL | Freq: Once | INTRAVENOUS | Status: AC | PRN
  Administered 2024-05-04: 7.5 mL via INTRAVENOUS

## 2024-05-05 ENCOUNTER — Ambulatory Visit (INDEPENDENT_AMBULATORY_CARE_PROVIDER_SITE_OTHER): Admitting: Otolaryngology

## 2024-05-05 ENCOUNTER — Encounter (INDEPENDENT_AMBULATORY_CARE_PROVIDER_SITE_OTHER): Payer: Self-pay | Admitting: Otolaryngology

## 2024-05-05 VITALS — BP 101/66 | HR 80 | Temp 97.6°F | Ht 62.0 in | Wt 135.0 lb

## 2024-05-05 DIAGNOSIS — H6123 Impacted cerumen, bilateral: Secondary | ICD-10-CM

## 2024-05-05 NOTE — Progress Notes (Signed)
 Patient ID: Joyce Hayes, female   DOB: April 29, 1956, 68 y.o.   MRN: [id number]  Procedure: Bilateral cerumen disimpaction.   Indication: Recurrent cerumen impaction, resulting in ear discomfort and conductive hearing loss.   Description: The patient is placed supine on the operating table. Under the operating microscope, the right ear canal is examined and is noted to be impacted with cerumen. The cerumen is carefully removed with a combination of suction catheters, cerumen curette, and alligator forceps. After the cerumen removal, the ear canal and tympanic membrane are noted to be normal. No middle ear effusion is noted. The same procedure is then repeated on the left side without exception. The patient tolerated the procedure well.  Follow-up care:  The patient will follow up in 8 months.

## 2024-05-10 ENCOUNTER — Ambulatory Visit: Payer: Self-pay | Admitting: Neurology

## 2024-05-10 NOTE — Progress Notes (Unsigned)
 NEUROLOGY FOLLOW UP OFFICE NOTE  Joyce Hayes [id number]  Assessment/Plan:   1  Multiple sclerosis 2  Headache 3  Depression/anxiety   DMT:  teriflunomide  14mg  daily. *** Repeat MRI of brain and C-spine with and without contrast in 6 months Repeat CBC with diff, LFTs and Vit D in 6 months. continue duloxetine , amitriptyline  and olanzapine .  D3 5000 IU daily Follow up 6 months (after repeat testing performed)     Subjective:  Joyce Hayes is a 68 year old -rightnded female with MS, HTN, asthma and anxiety who follows up for multiple sclerosis.   UPDATE: Current DMT:  Teriflunomide  14mg  daily (started November 2024) Current medications:  Cymbalta  60mg  QD (pain and depression), amitriptyline  10mg  QHS (sleep, headache and depression), olanzapine  5mg  QHS (sleep, paranoid ideation), diazepam  5mg  BID PRN, D3 5000 IU QD, B12 2000mcg QD, CoQ-10  03/29/2024 LABS:    CBC with WBC 5.9, HGB 13, HCT 40.1, PLT 272; vit D 101; hepatic panel with t bili 0.6, ALP 62, AST 22, ALT 10.  Tolerating the new DMT. Since stopping Ocrevus : notes more lethargy notes pins and needles pain in her right leg and sometimes left leg.   Also feels like she is walking more unsteady.   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  Feels more pressure in the right eye.   Motor:  No issues but sometimes grasping with her hands feels a little weak with overuse.  Sometimes left arm twitches.   Sensory: Right big toe feels tingling again.  Comes and goes.   Pain:  Chronic neck pain.  Chronic l low back pain, right leg radiculopathy Headaches:  a pounding pain across the forehead.  No nausea, vomiting, photophobia, phonophobia or visual disturbance.  Typically last 30 minutes and occur about once or twice a month.  Treats with Advil.  Often associated with elevated blood pressure. Gait:  Sometimes feels a little off balance. Bowel/Bladder:   Some urinary incontinence.  History of constipation currently well-managed. Fatigue:  Sometimes feels tired but nothing significant. Cognition:  Mild cognitive disorder. Mood:  Anxiety, depression - comes and goes.  Separated from her husband.  History of paranoid delusions (previously believed that her husband was planning to kill her and that FBI was working with her).   HISTORY: Diagnosed with MS in 2000 presenting with right foot numbness.  Diagnosed via MRI.  Did not have an LP.  Treated with steroids.   Past DMT:  Avonex (2000-2001, stopped due to flu-like symptoms), Rebif (2001-early 2019 (injection site reaction, depression, lost efficacy), Ocrevus  (09/17/2017-2024) Other past medications:  Wellbutrin   Covid-19 Vaccine:  Moderna - second dose 10/14/2019   Imaging: 11/03/2007 MRI BRAIN:  Reportedly stable compared to prior study from 2007. 06/15/2009 MRI BRAIN:  There are several nonenhancing periventricular white matter and juxtacortical white matter hyperintensities typical of MS.  There is mild atrophy and ventricular enlargement.  Reportedly showed possible minimal progression compared with prior study from 06/20/2005. 12/11/2009 MRI C-SPINE:  Nonenhancing MS lesions, C2 and C4-5.  Minor disc disease. 12/11/2009 MRI T-SPINE:  Nonenhancing MS lesion, T2-3. 08/22/2014 MRI C-SPINE:  Nonenhancing plaque in the dorsal column at C4 and C4-5, unchanged from prior study of 04/21/2012.  The previously described C2 lesion back in 2011 is not seen.  There is a sbutle lesion noted at T2-3. 08/22/2014 MRI T-SPINE:  Limited quality; T2-3 lesion is better seen on C-spine imaging in  2013 and 2016 and appears unchanged. 04/21/2017 MRI BRAIN:  Unchanged from prior study of 08/22/2014 04/21/2017 MRI C-SPINE:  Unchanged from prior study of 08/22/2014 03/30/2019 MRI BRAIN:  Unchanged from 04/21/2017. 03/30/2019 MRI C-SPINE:  Unchanged from 04/21/2017, with a stable right dorsal hyperintesity at C4-5 typical of  MS. 01/06/2021 MRI BRAIN W WO:  moderately advanced chronic demyelinating disease limited to the cerebral white matter with no active demyelination.   01/06/2021 MRI C-SPINE W WO:  subtle evidence of chronic demyelinating disease in the cervical spinal cord at C4-5 level. 09/05/2021 MRI BRAIN W WO:  Stable compared to 12/2020 09/05/2021 MRI C-SPINE W WO:  Stable compared to 12/2020 02/04/2023 MRI BRAIN W WO:  No significant interval change in the white matter lesions present in both cerebral hemispheres. No new or enhancing lesions appreciated.  02/04/2023 MRI C-SPINE W WO:  1.  Stable appearance of the small focal T2-weighted hyperintensity at the C4-C5 level without any associated enhancement. No new lesions appreciated.  2.  Degenerative changes as described above that is most notable for moderate neuroforaminal narrowing bilaterally at C4-C5 and C5-C6.    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  Severe weaknesses in visual spatial and verbal function noted.  Moderate impairment in executive function, attention, problem solving, working memory, and global cognitive score.  Pure memory was only slightly diminished according to [employer].   No family history of MS.  Mother had Alzheimer's disease.  PAST MEDICAL HISTORY: Past Medical History:  Diagnosis Date   Anxiety    Asthma    Hypertension    Multiple sclerosis     MEDICATIONS: Current Outpatient Medications on File Prior to Visit  Medication Sig Dispense Refill   albuterol  (VENTOLIN  HFA) 108 (90 Base) MCG/ACT inhaler Inhale into the lungs every 6 (six) hours as needed for wheezing or shortness of breath.     amitriptyline  (ELAVIL ) 10 MG tablet TAKE 1 TABLET AT BEDTIME 90 tablet 0   amLODipine (NORVASC) 5 MG tablet Take 5 mg by mouth daily.     atorvastatin (LIPITOR) 10 MG tablet Take 1 tablet by mouth at bedtime.     bisoprolol-hydrochlorothiazide (ZIAC)  5-6.25 MG tablet 1 tablet     cholecalciferol (VITAMIN D3) 25 MCG (1000 UNIT) tablet 1 T QD     Coenzyme Q10 (CO Q 10 PO) Take by mouth.     cyanocobalamin 2000 MCG tablet      diazepam  (VALIUM ) 5 MG tablet Take 1 tablet 30-40 minutes prior to MRI.  May take another tablet at the MRI if needed. 2 tablet 0   DULoxetine  (CYMBALTA ) 60 MG capsule TAKE 1 CAPSULE EVERY DAY 90 capsule 3   estradiol (ESTRACE) 0.1 MG/GM vaginal cream Place 1 Applicatorful vaginally at bedtime.     fluticasone -salmeterol (WIXELA INHUB) 250-50 MCG/ACT AEPB Inhale 1 puff into the lungs in the morning and at bedtime. 60 each 5   Multiple Vitamins-Minerals (HAIR/SKIN/NAILS/BIOTIN PO) Take 500 mg by mouth. One time daily     OLANZapine  (ZYPREXA ) 5 MG tablet TAKE 1 TABLET AT BEDTIME 90 tablet 0   saccharomyces boulardii (FLORASTOR) 250 MG capsule Take 250 mg by mouth daily. One daily     Teriflunomide  14 MG TABS TAKE 1 TABLET (14 MG TOTAL) BY MOUTH DAILY 30 tablet 0   No current facility-administered medications on file prior to visit.    ALLERGIES: Allergies  Allergen Reactions   Coconut (Cocos Nucifera) Itching and Shortness Of Breath  Chocolate    Flavoring Agent Itching and Other (See Comments)   Grass Pollen(K-O-R-T-Swt Vern) Other (See Comments)   Soybean-Containing Drug Products Other (See Comments)    FAMILY HISTORY: Family History  Problem Relation Age of Onset   Dementia Mother    Hypertension Mother    Alzheimer's disease Mother    Allergic Disorder Mother    Asthma Mother    Hypertension Father    Osteoporosis Father    Hepatitis C Brother    Asthma Daughter       Objective:  *** General: No acute distress.  Patient appears well-groomed.   Head:  Normocephalic/atraumatic Neck:  Supple.  No paraspinal tenderness.  Full range of motion. Heart:  Regular rate and rhythm. Neuro:  Alert and oriented.  Speech fluent and not dysarthric.  Language intact.  CN II-XII intact.  Bulk and tone normal.   Muscle strength 5/5 throughout.  Sensation to light touch intact.  Deep tendon reflexes 2+ throughout, toes downgoing.  Gait normal.  Romberg negative.     Juliene Dunnings, DO  CC: Prentice Pinal, MD

## 2024-05-10 NOTE — Progress Notes (Signed)
 Patient advised.

## 2024-05-11 ENCOUNTER — Ambulatory Visit (INDEPENDENT_AMBULATORY_CARE_PROVIDER_SITE_OTHER): Admitting: Neurology

## 2024-05-11 ENCOUNTER — Encounter: Payer: Self-pay | Admitting: Neurology

## 2024-05-11 VITALS — BP 115/76 | HR 83 | Ht 62.0 in | Wt 135.8 lb

## 2024-05-11 DIAGNOSIS — R519 Headache, unspecified: Secondary | ICD-10-CM | POA: Diagnosis not present

## 2024-05-11 DIAGNOSIS — G35D Multiple sclerosis, unspecified: Secondary | ICD-10-CM

## 2024-05-11 NOTE — Patient Instructions (Signed)
 Stop teriflunomide  Continue duloxetine , amitriptyline , olanzapine  and D3 5000 I U daily Repeat vit D level in 6 months Repeat MRI of brain and cervical spine with and without contrast in one year Follow up in 6 months (about a week after repeat vit D)

## 2024-05-22 ENCOUNTER — Other Ambulatory Visit: Payer: Self-pay | Admitting: Neurology

## 2024-06-11 ENCOUNTER — Telehealth: Payer: Self-pay | Admitting: Neurology

## 2024-06-11 NOTE — Telephone Encounter (Signed)
 Called pharmacy back and reported that Dr. Skeet  notes DMT:  Due to age and the fact that she hasn't had a flare up since diagnosis in 2000 and has demonstrated clinical and MRI stability for years, will discontinue teriflunomide . I [facility] with Mel G at  [facility] he understood.

## 2024-06-11 NOTE — Telephone Encounter (Signed)
 Vertell sharps from [facility] called 06/09/24 and LM with AN. She needs a new script for Teriflunomide  14mg  [phone]

## 2024-06-15 ENCOUNTER — Telehealth: Payer: Self-pay | Admitting: Neurology

## 2024-06-15 NOTE — Telephone Encounter (Signed)
 Telephone call to the patient, Per dr.Jaffe patient needs to see an Ophthalmologist if she do not have ine Urgently.   Per patient patient she is having problems in her Right eye. She was referred to the Retina specialist to have surgery on her left .    Per patient she has one here in [facility]  Ophthalmologist Dr. Enriqueta Reusing. She has an appt in March but did not advise them of her issue she is having now.  Advised patient if her Provider in [place]  Dx her with Optic Neurits and she feels like she is having symptoms please give them a call back to see if she can be seen sooner then March 2026.

## 2024-06-15 NOTE — Telephone Encounter (Signed)
 Pt called in this afternoon. 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 Thanks

## 2024-06-16 NOTE — Telephone Encounter (Signed)
 PT is returning a call to someone

## 2024-07-08 ENCOUNTER — Other Ambulatory Visit: Payer: Self-pay | Admitting: Neurology

## 2024-09-29 ENCOUNTER — Ambulatory Visit

## 2024-10-26 ENCOUNTER — Ambulatory Visit (INDEPENDENT_AMBULATORY_CARE_PROVIDER_SITE_OTHER): Admitting: Otolaryngology

## 2024-11-02 ENCOUNTER — Ambulatory Visit: Admitting: Neurology

## 2025-05-03 ENCOUNTER — Other Ambulatory Visit
